# Patient Record
Sex: Female | Born: 1945
Health system: Southern US, Community
[De-identification: ages and names within clinical notes are randomized; demographics above are authoritative.]

## PROBLEM LIST (undated history)

## (undated) DIAGNOSIS — G2581 Restless legs syndrome: Secondary | ICD-10-CM

## (undated) DIAGNOSIS — S99922A Unspecified injury of left foot, initial encounter: Secondary | ICD-10-CM

## (undated) DIAGNOSIS — E785 Hyperlipidemia, unspecified: Secondary | ICD-10-CM

## (undated) DIAGNOSIS — M779 Enthesopathy, unspecified: Secondary | ICD-10-CM

## (undated) DIAGNOSIS — M84475D Pathological fracture, left foot, subsequent encounter for fracture with routine healing: Secondary | ICD-10-CM

## (undated) DIAGNOSIS — M1990 Unspecified osteoarthritis, unspecified site: Secondary | ICD-10-CM

## (undated) DIAGNOSIS — M7120 Synovial cyst of popliteal space [Baker], unspecified knee: Secondary | ICD-10-CM

## (undated) DIAGNOSIS — K219 Gastro-esophageal reflux disease without esophagitis: Secondary | ICD-10-CM

## (undated) DIAGNOSIS — G43909 Migraine, unspecified, not intractable, without status migrainosus: Secondary | ICD-10-CM

## (undated) DIAGNOSIS — I10 Essential (primary) hypertension: Secondary | ICD-10-CM

## (undated) DIAGNOSIS — I5022 Chronic systolic (congestive) heart failure: Secondary | ICD-10-CM

## (undated) DIAGNOSIS — I447 Left bundle-branch block, unspecified: Secondary | ICD-10-CM

## (undated) DIAGNOSIS — R079 Chest pain, unspecified: Secondary | ICD-10-CM

## (undated) DIAGNOSIS — M19072 Primary osteoarthritis, left ankle and foot: Secondary | ICD-10-CM

## (undated) HISTORY — DX: Left bundle-branch block, unspecified: I44.7

## (undated) HISTORY — DX: Gastro-esophageal reflux disease without esophagitis: K21.9

## (undated) HISTORY — PX: CHOLECYSTECTOMY: SHX55

## (undated) HISTORY — DX: Unspecified injury of left foot, initial encounter: S99.922A

## (undated) HISTORY — DX: Pathological fracture, left foot, subsequent encounter for fracture with routine healing: M84.475D

## (undated) HISTORY — DX: Primary osteoarthritis, left ankle and foot: M19.072

## (undated) HISTORY — PX: VEIN SURGERY: SHX48

## (undated) HISTORY — DX: Chest pain, unspecified: R07.9

## (undated) HISTORY — DX: Enthesopathy, unspecified: M77.9

## (undated) HISTORY — DX: Migraine, unspecified, not intractable, without status migrainosus: G43.909

## (undated) HISTORY — DX: Unspecified osteoarthritis, unspecified site: M19.90

## (undated) HISTORY — DX: Essential (primary) hypertension: I10

## (undated) HISTORY — PX: FOOT FRACTURE SURGERY: SHX645

## (undated) HISTORY — DX: Synovial cyst of popliteal space (Baker), unspecified knee: M71.20

## (undated) HISTORY — DX: Chronic systolic (congestive) heart failure: I50.22

## (undated) HISTORY — PX: ABDOMINAL HYSTERECTOMY: SHX81

## (undated) HISTORY — DX: Restless legs syndrome: G25.81

## (undated) HISTORY — PX: TONSILLECTOMY: SUR1361

## (undated) HISTORY — DX: Hyperlipidemia, unspecified: E78.5

---

## 2015-04-25 DIAGNOSIS — R079 Chest pain, unspecified: Secondary | ICD-10-CM

## 2015-04-25 DIAGNOSIS — I447 Left bundle-branch block, unspecified: Secondary | ICD-10-CM

## 2015-04-25 HISTORY — DX: Chest pain, unspecified: R07.9

## 2015-04-25 HISTORY — DX: Left bundle-branch block, unspecified: I44.7

## 2015-04-26 DIAGNOSIS — G2581 Restless legs syndrome: Secondary | ICD-10-CM

## 2015-04-26 DIAGNOSIS — I10 Essential (primary) hypertension: Secondary | ICD-10-CM

## 2015-04-26 HISTORY — DX: Restless legs syndrome: G25.81

## 2015-04-26 HISTORY — DX: Essential (primary) hypertension: I10

## 2015-05-16 DIAGNOSIS — R079 Chest pain, unspecified: Secondary | ICD-10-CM | POA: Diagnosis not present

## 2015-05-16 DIAGNOSIS — I447 Left bundle-branch block, unspecified: Secondary | ICD-10-CM | POA: Diagnosis not present

## 2015-05-16 DIAGNOSIS — R011 Cardiac murmur, unspecified: Secondary | ICD-10-CM | POA: Diagnosis not present

## 2015-05-24 DIAGNOSIS — I10 Essential (primary) hypertension: Secondary | ICD-10-CM | POA: Diagnosis not present

## 2015-05-24 DIAGNOSIS — I447 Left bundle-branch block, unspecified: Secondary | ICD-10-CM | POA: Diagnosis not present

## 2015-06-20 DIAGNOSIS — E876 Hypokalemia: Secondary | ICD-10-CM | POA: Diagnosis not present

## 2015-06-27 DIAGNOSIS — S99922A Unspecified injury of left foot, initial encounter: Secondary | ICD-10-CM | POA: Diagnosis not present

## 2015-07-11 DIAGNOSIS — J208 Acute bronchitis due to other specified organisms: Secondary | ICD-10-CM | POA: Diagnosis not present

## 2015-07-11 DIAGNOSIS — I10 Essential (primary) hypertension: Secondary | ICD-10-CM | POA: Diagnosis not present

## 2015-07-11 DIAGNOSIS — B9689 Other specified bacterial agents as the cause of diseases classified elsewhere: Secondary | ICD-10-CM | POA: Diagnosis not present

## 2015-07-18 DIAGNOSIS — S92342D Displaced fracture of fourth metatarsal bone, left foot, subsequent encounter for fracture with routine healing: Secondary | ICD-10-CM | POA: Diagnosis not present

## 2015-07-25 DIAGNOSIS — S99922A Unspecified injury of left foot, initial encounter: Secondary | ICD-10-CM

## 2015-07-25 DIAGNOSIS — S99922D Unspecified injury of left foot, subsequent encounter: Secondary | ICD-10-CM | POA: Diagnosis not present

## 2015-07-25 HISTORY — DX: Unspecified injury of left foot, initial encounter: S99.922A

## 2015-08-02 DIAGNOSIS — S4992XA Unspecified injury of left shoulder and upper arm, initial encounter: Secondary | ICD-10-CM | POA: Diagnosis not present

## 2015-08-02 DIAGNOSIS — S0990XA Unspecified injury of head, initial encounter: Secondary | ICD-10-CM | POA: Diagnosis not present

## 2015-08-02 DIAGNOSIS — R109 Unspecified abdominal pain: Secondary | ICD-10-CM | POA: Diagnosis not present

## 2015-08-02 DIAGNOSIS — M79672 Pain in left foot: Secondary | ICD-10-CM | POA: Diagnosis not present

## 2015-08-02 DIAGNOSIS — S299XXA Unspecified injury of thorax, initial encounter: Secondary | ICD-10-CM | POA: Diagnosis not present

## 2015-08-02 DIAGNOSIS — M25512 Pain in left shoulder: Secondary | ICD-10-CM | POA: Diagnosis not present

## 2015-08-02 DIAGNOSIS — R51 Headache: Secondary | ICD-10-CM | POA: Diagnosis not present

## 2015-08-02 DIAGNOSIS — R918 Other nonspecific abnormal finding of lung field: Secondary | ICD-10-CM | POA: Diagnosis not present

## 2015-08-02 DIAGNOSIS — M25532 Pain in left wrist: Secondary | ICD-10-CM | POA: Diagnosis not present

## 2015-08-02 DIAGNOSIS — S161XXA Strain of muscle, fascia and tendon at neck level, initial encounter: Secondary | ICD-10-CM | POA: Diagnosis not present

## 2015-08-02 DIAGNOSIS — J3489 Other specified disorders of nose and nasal sinuses: Secondary | ICD-10-CM | POA: Diagnosis not present

## 2015-08-02 DIAGNOSIS — S8991XA Unspecified injury of right lower leg, initial encounter: Secondary | ICD-10-CM | POA: Diagnosis not present

## 2015-08-02 DIAGNOSIS — M25531 Pain in right wrist: Secondary | ICD-10-CM | POA: Diagnosis not present

## 2015-08-02 DIAGNOSIS — M25561 Pain in right knee: Secondary | ICD-10-CM | POA: Diagnosis not present

## 2015-08-02 DIAGNOSIS — S6991XA Unspecified injury of right wrist, hand and finger(s), initial encounter: Secondary | ICD-10-CM | POA: Diagnosis not present

## 2015-08-02 DIAGNOSIS — S0993XA Unspecified injury of face, initial encounter: Secondary | ICD-10-CM | POA: Diagnosis not present

## 2015-08-02 DIAGNOSIS — T148 Other injury of unspecified body region: Secondary | ICD-10-CM | POA: Diagnosis not present

## 2015-08-02 DIAGNOSIS — S6992XA Unspecified injury of left wrist, hand and finger(s), initial encounter: Secondary | ICD-10-CM | POA: Diagnosis not present

## 2015-08-02 DIAGNOSIS — S199XXA Unspecified injury of neck, initial encounter: Secondary | ICD-10-CM | POA: Diagnosis not present

## 2015-08-02 DIAGNOSIS — M79642 Pain in left hand: Secondary | ICD-10-CM | POA: Diagnosis not present

## 2015-08-15 DIAGNOSIS — M84475D Pathological fracture, left foot, subsequent encounter for fracture with routine healing: Secondary | ICD-10-CM

## 2015-08-15 DIAGNOSIS — M84478D Pathological fracture, left toe(s), subsequent encounter for fracture with routine healing: Secondary | ICD-10-CM | POA: Diagnosis not present

## 2015-08-15 HISTORY — DX: Pathological fracture, left foot, subsequent encounter for fracture with routine healing: M84.475D

## 2015-09-19 DIAGNOSIS — L570 Actinic keratosis: Secondary | ICD-10-CM | POA: Diagnosis not present

## 2015-09-19 DIAGNOSIS — L0100 Impetigo, unspecified: Secondary | ICD-10-CM | POA: Diagnosis not present

## 2015-11-10 DIAGNOSIS — Z0000 Encounter for general adult medical examination without abnormal findings: Secondary | ICD-10-CM | POA: Diagnosis not present

## 2015-11-14 DIAGNOSIS — M19072 Primary osteoarthritis, left ankle and foot: Secondary | ICD-10-CM

## 2015-11-14 DIAGNOSIS — M19272 Secondary osteoarthritis, left ankle and foot: Secondary | ICD-10-CM | POA: Diagnosis not present

## 2015-11-14 HISTORY — DX: Primary osteoarthritis, left ankle and foot: M19.072

## 2015-12-06 DIAGNOSIS — I10 Essential (primary) hypertension: Secondary | ICD-10-CM | POA: Diagnosis not present

## 2015-12-06 DIAGNOSIS — Z0000 Encounter for general adult medical examination without abnormal findings: Secondary | ICD-10-CM | POA: Diagnosis not present

## 2015-12-14 DIAGNOSIS — M778 Other enthesopathies, not elsewhere classified: Secondary | ICD-10-CM

## 2015-12-14 DIAGNOSIS — M779 Enthesopathy, unspecified: Secondary | ICD-10-CM

## 2015-12-14 DIAGNOSIS — M7752 Other enthesopathy of left foot: Secondary | ICD-10-CM | POA: Diagnosis not present

## 2015-12-14 HISTORY — DX: Other enthesopathies, not elsewhere classified: M77.8

## 2015-12-17 DIAGNOSIS — N3001 Acute cystitis with hematuria: Secondary | ICD-10-CM | POA: Diagnosis not present

## 2016-01-09 DIAGNOSIS — M19072 Primary osteoarthritis, left ankle and foot: Secondary | ICD-10-CM

## 2016-01-09 HISTORY — DX: Primary osteoarthritis, left ankle and foot: M19.072

## 2016-01-27 DIAGNOSIS — J209 Acute bronchitis, unspecified: Secondary | ICD-10-CM | POA: Diagnosis not present

## 2016-01-27 DIAGNOSIS — J449 Chronic obstructive pulmonary disease, unspecified: Secondary | ICD-10-CM | POA: Diagnosis not present

## 2016-02-15 DIAGNOSIS — S6291XA Unspecified fracture of right wrist and hand, initial encounter for closed fracture: Secondary | ICD-10-CM | POA: Diagnosis not present

## 2016-02-19 DIAGNOSIS — S62306A Unspecified fracture of fifth metacarpal bone, right hand, initial encounter for closed fracture: Secondary | ICD-10-CM | POA: Diagnosis not present

## 2016-02-26 DIAGNOSIS — S62306A Unspecified fracture of fifth metacarpal bone, right hand, initial encounter for closed fracture: Secondary | ICD-10-CM | POA: Diagnosis not present

## 2016-02-27 DIAGNOSIS — J208 Acute bronchitis due to other specified organisms: Secondary | ICD-10-CM | POA: Diagnosis not present

## 2016-02-27 DIAGNOSIS — B9689 Other specified bacterial agents as the cause of diseases classified elsewhere: Secondary | ICD-10-CM | POA: Diagnosis not present

## 2016-02-27 DIAGNOSIS — I10 Essential (primary) hypertension: Secondary | ICD-10-CM | POA: Diagnosis not present

## 2016-03-04 DIAGNOSIS — A481 Legionnaires' disease: Secondary | ICD-10-CM | POA: Diagnosis not present

## 2016-03-04 DIAGNOSIS — R05 Cough: Secondary | ICD-10-CM | POA: Diagnosis not present

## 2016-03-04 DIAGNOSIS — I10 Essential (primary) hypertension: Secondary | ICD-10-CM | POA: Diagnosis not present

## 2016-03-04 DIAGNOSIS — R69 Illness, unspecified: Secondary | ICD-10-CM | POA: Diagnosis not present

## 2016-03-04 DIAGNOSIS — A3701 Whooping cough due to Bordetella pertussis with pneumonia: Secondary | ICD-10-CM | POA: Diagnosis not present

## 2016-03-11 DIAGNOSIS — S62306A Unspecified fracture of fifth metacarpal bone, right hand, initial encounter for closed fracture: Secondary | ICD-10-CM | POA: Diagnosis not present

## 2016-03-15 DIAGNOSIS — M8588 Other specified disorders of bone density and structure, other site: Secondary | ICD-10-CM | POA: Diagnosis not present

## 2016-03-15 DIAGNOSIS — M8580 Other specified disorders of bone density and structure, unspecified site: Secondary | ICD-10-CM | POA: Diagnosis not present

## 2016-03-15 DIAGNOSIS — Z780 Asymptomatic menopausal state: Secondary | ICD-10-CM | POA: Diagnosis not present

## 2016-03-28 DIAGNOSIS — G8929 Other chronic pain: Secondary | ICD-10-CM | POA: Diagnosis not present

## 2016-03-28 DIAGNOSIS — A3790 Whooping cough, unspecified species without pneumonia: Secondary | ICD-10-CM | POA: Diagnosis not present

## 2016-03-28 DIAGNOSIS — Z1389 Encounter for screening for other disorder: Secondary | ICD-10-CM | POA: Diagnosis not present

## 2016-03-28 DIAGNOSIS — M545 Low back pain: Secondary | ICD-10-CM | POA: Diagnosis not present

## 2016-03-28 DIAGNOSIS — G2581 Restless legs syndrome: Secondary | ICD-10-CM | POA: Diagnosis not present

## 2016-03-28 DIAGNOSIS — Z9181 History of falling: Secondary | ICD-10-CM | POA: Diagnosis not present

## 2016-04-01 DIAGNOSIS — S62306A Unspecified fracture of fifth metacarpal bone, right hand, initial encounter for closed fracture: Secondary | ICD-10-CM | POA: Diagnosis not present

## 2016-04-30 DIAGNOSIS — S62306K Unspecified fracture of fifth metacarpal bone, right hand, subsequent encounter for fracture with nonunion: Secondary | ICD-10-CM | POA: Diagnosis not present

## 2016-05-13 DIAGNOSIS — J111 Influenza due to unidentified influenza virus with other respiratory manifestations: Secondary | ICD-10-CM | POA: Diagnosis not present

## 2016-05-20 DIAGNOSIS — S62306K Unspecified fracture of fifth metacarpal bone, right hand, subsequent encounter for fracture with nonunion: Secondary | ICD-10-CM | POA: Diagnosis not present

## 2016-05-24 DIAGNOSIS — M810 Age-related osteoporosis without current pathological fracture: Secondary | ICD-10-CM | POA: Diagnosis not present

## 2016-05-24 DIAGNOSIS — S62306K Unspecified fracture of fifth metacarpal bone, right hand, subsequent encounter for fracture with nonunion: Secondary | ICD-10-CM | POA: Diagnosis not present

## 2016-06-26 DIAGNOSIS — I340 Nonrheumatic mitral (valve) insufficiency: Secondary | ICD-10-CM | POA: Diagnosis not present

## 2016-06-26 DIAGNOSIS — M940 Chondrocostal junction syndrome [Tietze]: Secondary | ICD-10-CM | POA: Diagnosis not present

## 2016-06-26 DIAGNOSIS — I447 Left bundle-branch block, unspecified: Secondary | ICD-10-CM | POA: Diagnosis not present

## 2016-06-26 DIAGNOSIS — I10 Essential (primary) hypertension: Secondary | ICD-10-CM | POA: Diagnosis not present

## 2016-07-01 DIAGNOSIS — S62306K Unspecified fracture of fifth metacarpal bone, right hand, subsequent encounter for fracture with nonunion: Secondary | ICD-10-CM | POA: Diagnosis not present

## 2016-07-10 DIAGNOSIS — I10 Essential (primary) hypertension: Secondary | ICD-10-CM | POA: Diagnosis not present

## 2016-07-12 DIAGNOSIS — R69 Illness, unspecified: Secondary | ICD-10-CM | POA: Diagnosis not present

## 2016-07-23 DIAGNOSIS — M159 Polyosteoarthritis, unspecified: Secondary | ICD-10-CM | POA: Diagnosis not present

## 2016-07-23 DIAGNOSIS — Z6834 Body mass index (BMI) 34.0-34.9, adult: Secondary | ICD-10-CM | POA: Diagnosis not present

## 2016-07-23 DIAGNOSIS — G2581 Restless legs syndrome: Secondary | ICD-10-CM | POA: Diagnosis not present

## 2016-07-23 DIAGNOSIS — R69 Illness, unspecified: Secondary | ICD-10-CM | POA: Diagnosis not present

## 2016-07-23 DIAGNOSIS — H259 Unspecified age-related cataract: Secondary | ICD-10-CM | POA: Diagnosis not present

## 2016-07-23 DIAGNOSIS — Z0000 Encounter for general adult medical examination without abnormal findings: Secondary | ICD-10-CM | POA: Diagnosis not present

## 2016-07-23 DIAGNOSIS — K219 Gastro-esophageal reflux disease without esophagitis: Secondary | ICD-10-CM | POA: Diagnosis not present

## 2016-07-23 DIAGNOSIS — E669 Obesity, unspecified: Secondary | ICD-10-CM | POA: Diagnosis not present

## 2016-07-23 DIAGNOSIS — I10 Essential (primary) hypertension: Secondary | ICD-10-CM | POA: Diagnosis not present

## 2016-07-23 DIAGNOSIS — R600 Localized edema: Secondary | ICD-10-CM | POA: Diagnosis not present

## 2016-07-24 DIAGNOSIS — I10 Essential (primary) hypertension: Secondary | ICD-10-CM | POA: Diagnosis not present

## 2016-08-01 DIAGNOSIS — I10 Essential (primary) hypertension: Secondary | ICD-10-CM | POA: Diagnosis not present

## 2016-08-12 DIAGNOSIS — S62306K Unspecified fracture of fifth metacarpal bone, right hand, subsequent encounter for fracture with nonunion: Secondary | ICD-10-CM | POA: Diagnosis not present

## 2016-08-15 DIAGNOSIS — M25512 Pain in left shoulder: Secondary | ICD-10-CM | POA: Diagnosis not present

## 2016-09-04 DIAGNOSIS — I10 Essential (primary) hypertension: Secondary | ICD-10-CM | POA: Diagnosis not present

## 2016-09-04 DIAGNOSIS — G2581 Restless legs syndrome: Secondary | ICD-10-CM | POA: Diagnosis not present

## 2016-09-04 DIAGNOSIS — J309 Allergic rhinitis, unspecified: Secondary | ICD-10-CM | POA: Diagnosis not present

## 2016-09-08 DIAGNOSIS — J189 Pneumonia, unspecified organism: Secondary | ICD-10-CM | POA: Diagnosis not present

## 2016-09-08 DIAGNOSIS — R69 Illness, unspecified: Secondary | ICD-10-CM | POA: Diagnosis not present

## 2016-09-19 DIAGNOSIS — Z0000 Encounter for general adult medical examination without abnormal findings: Secondary | ICD-10-CM | POA: Diagnosis not present

## 2016-09-19 DIAGNOSIS — I5030 Unspecified diastolic (congestive) heart failure: Secondary | ICD-10-CM | POA: Diagnosis not present

## 2016-09-19 DIAGNOSIS — M545 Low back pain: Secondary | ICD-10-CM | POA: Diagnosis not present

## 2016-09-19 DIAGNOSIS — Z6833 Body mass index (BMI) 33.0-33.9, adult: Secondary | ICD-10-CM | POA: Diagnosis not present

## 2016-09-19 DIAGNOSIS — I110 Hypertensive heart disease with heart failure: Secondary | ICD-10-CM | POA: Diagnosis not present

## 2016-09-26 DIAGNOSIS — L821 Other seborrheic keratosis: Secondary | ICD-10-CM | POA: Diagnosis not present

## 2016-09-26 DIAGNOSIS — L570 Actinic keratosis: Secondary | ICD-10-CM | POA: Diagnosis not present

## 2016-09-26 DIAGNOSIS — R233 Spontaneous ecchymoses: Secondary | ICD-10-CM | POA: Diagnosis not present

## 2016-10-21 DIAGNOSIS — L03116 Cellulitis of left lower limb: Secondary | ICD-10-CM | POA: Diagnosis not present

## 2016-10-21 DIAGNOSIS — L03115 Cellulitis of right lower limb: Secondary | ICD-10-CM | POA: Diagnosis not present

## 2016-10-21 DIAGNOSIS — R600 Localized edema: Secondary | ICD-10-CM | POA: Diagnosis not present

## 2016-10-21 DIAGNOSIS — Z6834 Body mass index (BMI) 34.0-34.9, adult: Secondary | ICD-10-CM | POA: Diagnosis not present

## 2016-10-24 DIAGNOSIS — L03116 Cellulitis of left lower limb: Secondary | ICD-10-CM | POA: Diagnosis not present

## 2016-10-24 DIAGNOSIS — Z6834 Body mass index (BMI) 34.0-34.9, adult: Secondary | ICD-10-CM | POA: Diagnosis not present

## 2016-10-24 DIAGNOSIS — R600 Localized edema: Secondary | ICD-10-CM | POA: Diagnosis not present

## 2016-10-24 DIAGNOSIS — L03115 Cellulitis of right lower limb: Secondary | ICD-10-CM | POA: Diagnosis not present

## 2016-10-25 DIAGNOSIS — L03115 Cellulitis of right lower limb: Secondary | ICD-10-CM | POA: Diagnosis not present

## 2016-10-25 DIAGNOSIS — L03119 Cellulitis of unspecified part of limb: Secondary | ICD-10-CM | POA: Diagnosis not present

## 2016-10-25 DIAGNOSIS — L03116 Cellulitis of left lower limb: Secondary | ICD-10-CM | POA: Diagnosis not present

## 2016-11-04 DIAGNOSIS — Z6834 Body mass index (BMI) 34.0-34.9, adult: Secondary | ICD-10-CM | POA: Diagnosis not present

## 2016-11-04 DIAGNOSIS — T677XXA Heat edema, initial encounter: Secondary | ICD-10-CM | POA: Diagnosis not present

## 2016-11-04 DIAGNOSIS — I509 Heart failure, unspecified: Secondary | ICD-10-CM | POA: Diagnosis not present

## 2016-11-15 DIAGNOSIS — M7552 Bursitis of left shoulder: Secondary | ICD-10-CM | POA: Diagnosis not present

## 2016-11-20 DIAGNOSIS — M1990 Unspecified osteoarthritis, unspecified site: Secondary | ICD-10-CM | POA: Diagnosis not present

## 2016-11-20 DIAGNOSIS — I5032 Chronic diastolic (congestive) heart failure: Secondary | ICD-10-CM | POA: Diagnosis not present

## 2016-11-20 DIAGNOSIS — I5030 Unspecified diastolic (congestive) heart failure: Secondary | ICD-10-CM | POA: Diagnosis not present

## 2016-11-20 DIAGNOSIS — Z6833 Body mass index (BMI) 33.0-33.9, adult: Secondary | ICD-10-CM | POA: Diagnosis not present

## 2016-11-20 DIAGNOSIS — G8929 Other chronic pain: Secondary | ICD-10-CM | POA: Diagnosis not present

## 2016-11-20 DIAGNOSIS — I110 Hypertensive heart disease with heart failure: Secondary | ICD-10-CM | POA: Diagnosis not present

## 2016-11-20 DIAGNOSIS — M545 Low back pain: Secondary | ICD-10-CM | POA: Diagnosis not present

## 2016-11-26 DIAGNOSIS — S81812A Laceration without foreign body, left lower leg, initial encounter: Secondary | ICD-10-CM | POA: Diagnosis not present

## 2016-11-29 ENCOUNTER — Other Ambulatory Visit: Payer: Self-pay

## 2016-11-29 MED ORDER — AMLODIPINE BESYLATE 5 MG PO TABS: 5.0000 mg | ORAL_TABLET | Freq: Two times a day (BID) | ORAL | 1 refills | Status: DC

## 2016-11-29 NOTE — Telephone Encounter (Signed)
Received fax from [facility] to refill amlodipine. Confirmed with patient that she will be following with Dr. Bettina Gavia. Refill sent through October at time that patient is to return for office visit.

## 2016-12-01 DIAGNOSIS — R197 Diarrhea, unspecified: Secondary | ICD-10-CM | POA: Diagnosis not present

## 2016-12-01 DIAGNOSIS — R109 Unspecified abdominal pain: Secondary | ICD-10-CM | POA: Diagnosis not present

## 2016-12-05 DIAGNOSIS — I110 Hypertensive heart disease with heart failure: Secondary | ICD-10-CM | POA: Diagnosis not present

## 2016-12-05 DIAGNOSIS — M1990 Unspecified osteoarthritis, unspecified site: Secondary | ICD-10-CM | POA: Diagnosis not present

## 2016-12-05 DIAGNOSIS — Z6833 Body mass index (BMI) 33.0-33.9, adult: Secondary | ICD-10-CM | POA: Diagnosis not present

## 2016-12-05 DIAGNOSIS — I5030 Unspecified diastolic (congestive) heart failure: Secondary | ICD-10-CM | POA: Diagnosis not present

## 2016-12-05 DIAGNOSIS — M545 Low back pain: Secondary | ICD-10-CM | POA: Diagnosis not present

## 2016-12-05 DIAGNOSIS — G8929 Other chronic pain: Secondary | ICD-10-CM | POA: Diagnosis not present

## 2016-12-05 DIAGNOSIS — I5032 Chronic diastolic (congestive) heart failure: Secondary | ICD-10-CM | POA: Diagnosis not present

## 2016-12-24 DIAGNOSIS — C44519 Basal cell carcinoma of skin of other part of trunk: Secondary | ICD-10-CM | POA: Diagnosis not present

## 2017-01-15 DIAGNOSIS — N10 Acute tubulo-interstitial nephritis: Secondary | ICD-10-CM | POA: Diagnosis not present

## 2017-01-15 DIAGNOSIS — M545 Low back pain: Secondary | ICD-10-CM | POA: Diagnosis not present

## 2017-01-15 DIAGNOSIS — S81802S Unspecified open wound, left lower leg, sequela: Secondary | ICD-10-CM | POA: Diagnosis not present

## 2017-01-15 DIAGNOSIS — Z6833 Body mass index (BMI) 33.0-33.9, adult: Secondary | ICD-10-CM | POA: Diagnosis not present

## 2017-01-16 DIAGNOSIS — S80812A Abrasion, left lower leg, initial encounter: Secondary | ICD-10-CM | POA: Diagnosis not present

## 2017-01-23 DIAGNOSIS — C44519 Basal cell carcinoma of skin of other part of trunk: Secondary | ICD-10-CM | POA: Diagnosis not present

## 2017-01-23 DIAGNOSIS — C44529 Squamous cell carcinoma of skin of other part of trunk: Secondary | ICD-10-CM | POA: Diagnosis not present

## 2017-01-25 DIAGNOSIS — S99102A Unspecified physeal fracture of left metatarsal, initial encounter for closed fracture: Secondary | ICD-10-CM | POA: Diagnosis not present

## 2017-01-25 DIAGNOSIS — M79672 Pain in left foot: Secondary | ICD-10-CM | POA: Diagnosis not present

## 2017-01-27 ENCOUNTER — Other Ambulatory Visit: Payer: Self-pay

## 2017-01-28 DIAGNOSIS — S92355A Nondisplaced fracture of fifth metatarsal bone, left foot, initial encounter for closed fracture: Secondary | ICD-10-CM | POA: Diagnosis not present

## 2017-01-28 DIAGNOSIS — S92355D Nondisplaced fracture of fifth metatarsal bone, left foot, subsequent encounter for fracture with routine healing: Secondary | ICD-10-CM | POA: Diagnosis not present

## 2017-01-28 DIAGNOSIS — M79672 Pain in left foot: Secondary | ICD-10-CM | POA: Diagnosis not present

## 2017-02-04 DIAGNOSIS — N10 Acute tubulo-interstitial nephritis: Secondary | ICD-10-CM | POA: Diagnosis not present

## 2017-02-06 DIAGNOSIS — M1990 Unspecified osteoarthritis, unspecified site: Secondary | ICD-10-CM

## 2017-02-06 DIAGNOSIS — G43909 Migraine, unspecified, not intractable, without status migrainosus: Secondary | ICD-10-CM

## 2017-02-06 DIAGNOSIS — E785 Hyperlipidemia, unspecified: Secondary | ICD-10-CM | POA: Insufficient documentation

## 2017-02-06 DIAGNOSIS — M7120 Synovial cyst of popliteal space [Baker], unspecified knee: Secondary | ICD-10-CM

## 2017-02-06 HISTORY — DX: Migraine, unspecified, not intractable, without status migrainosus: G43.909

## 2017-02-06 HISTORY — DX: Hyperlipidemia, unspecified: E78.5

## 2017-02-06 HISTORY — DX: Unspecified osteoarthritis, unspecified site: M19.90

## 2017-02-06 HISTORY — DX: Synovial cyst of popliteal space (Baker), unspecified knee: M71.20

## 2017-02-18 DIAGNOSIS — Z6833 Body mass index (BMI) 33.0-33.9, adult: Secondary | ICD-10-CM | POA: Diagnosis not present

## 2017-02-18 DIAGNOSIS — I5030 Unspecified diastolic (congestive) heart failure: Secondary | ICD-10-CM | POA: Diagnosis not present

## 2017-02-18 DIAGNOSIS — L97911 Non-pressure chronic ulcer of unspecified part of right lower leg limited to breakdown of skin: Secondary | ICD-10-CM | POA: Diagnosis not present

## 2017-02-18 DIAGNOSIS — I110 Hypertensive heart disease with heart failure: Secondary | ICD-10-CM | POA: Diagnosis not present

## 2017-02-18 DIAGNOSIS — Z23 Encounter for immunization: Secondary | ICD-10-CM | POA: Diagnosis not present

## 2017-02-18 DIAGNOSIS — S92355D Nondisplaced fracture of fifth metatarsal bone, left foot, subsequent encounter for fracture with routine healing: Secondary | ICD-10-CM | POA: Diagnosis not present

## 2017-02-18 DIAGNOSIS — S92352G Displaced fracture of fifth metatarsal bone, left foot, subsequent encounter for fracture with delayed healing: Secondary | ICD-10-CM | POA: Diagnosis not present

## 2017-02-18 DIAGNOSIS — M84375K Stress fracture, left foot, subsequent encounter for fracture with nonunion: Secondary | ICD-10-CM | POA: Diagnosis not present

## 2017-02-18 DIAGNOSIS — M810 Age-related osteoporosis without current pathological fracture: Secondary | ICD-10-CM | POA: Diagnosis not present

## 2017-02-19 ENCOUNTER — Ambulatory Visit: Payer: Self-pay | Admitting: Cardiology

## 2017-02-19 DIAGNOSIS — L97929 Non-pressure chronic ulcer of unspecified part of left lower leg with unspecified severity: Secondary | ICD-10-CM

## 2017-02-19 DIAGNOSIS — I83029 Varicose veins of left lower extremity with ulcer of unspecified site: Secondary | ICD-10-CM

## 2017-02-19 HISTORY — DX: Non-pressure chronic ulcer of unspecified part of left lower leg with unspecified severity: L97.929

## 2017-02-19 HISTORY — DX: Varicose veins of left lower extremity with ulcer of unspecified site: I83.029

## 2017-02-21 DIAGNOSIS — J449 Chronic obstructive pulmonary disease, unspecified: Secondary | ICD-10-CM | POA: Diagnosis not present

## 2017-02-21 DIAGNOSIS — L97812 Non-pressure chronic ulcer of other part of right lower leg with fat layer exposed: Secondary | ICD-10-CM | POA: Diagnosis not present

## 2017-02-21 DIAGNOSIS — L97822 Non-pressure chronic ulcer of other part of left lower leg with fat layer exposed: Secondary | ICD-10-CM | POA: Diagnosis not present

## 2017-02-21 DIAGNOSIS — S81801A Unspecified open wound, right lower leg, initial encounter: Secondary | ICD-10-CM | POA: Diagnosis not present

## 2017-02-21 DIAGNOSIS — I872 Venous insufficiency (chronic) (peripheral): Secondary | ICD-10-CM | POA: Diagnosis not present

## 2017-02-21 DIAGNOSIS — L03116 Cellulitis of left lower limb: Secondary | ICD-10-CM | POA: Diagnosis not present

## 2017-02-21 DIAGNOSIS — X58XXXA Exposure to other specified factors, initial encounter: Secondary | ICD-10-CM | POA: Diagnosis not present

## 2017-02-21 DIAGNOSIS — I87311 Chronic venous hypertension (idiopathic) with ulcer of right lower extremity: Secondary | ICD-10-CM | POA: Diagnosis not present

## 2017-02-21 DIAGNOSIS — M069 Rheumatoid arthritis, unspecified: Secondary | ICD-10-CM | POA: Diagnosis not present

## 2017-02-28 DIAGNOSIS — S81801D Unspecified open wound, right lower leg, subsequent encounter: Secondary | ICD-10-CM | POA: Diagnosis not present

## 2017-02-28 DIAGNOSIS — L03116 Cellulitis of left lower limb: Secondary | ICD-10-CM | POA: Diagnosis not present

## 2017-02-28 DIAGNOSIS — I87331 Chronic venous hypertension (idiopathic) with ulcer and inflammation of right lower extremity: Secondary | ICD-10-CM | POA: Diagnosis not present

## 2017-02-28 DIAGNOSIS — L97822 Non-pressure chronic ulcer of other part of left lower leg with fat layer exposed: Secondary | ICD-10-CM | POA: Diagnosis not present

## 2017-02-28 DIAGNOSIS — X58XXXD Exposure to other specified factors, subsequent encounter: Secondary | ICD-10-CM | POA: Diagnosis not present

## 2017-03-07 DIAGNOSIS — X58XXXD Exposure to other specified factors, subsequent encounter: Secondary | ICD-10-CM | POA: Diagnosis not present

## 2017-03-07 DIAGNOSIS — S81801D Unspecified open wound, right lower leg, subsequent encounter: Secondary | ICD-10-CM | POA: Diagnosis not present

## 2017-03-07 DIAGNOSIS — L97822 Non-pressure chronic ulcer of other part of left lower leg with fat layer exposed: Secondary | ICD-10-CM | POA: Diagnosis not present

## 2017-03-07 DIAGNOSIS — L97812 Non-pressure chronic ulcer of other part of right lower leg with fat layer exposed: Secondary | ICD-10-CM | POA: Diagnosis not present

## 2017-03-07 DIAGNOSIS — L03116 Cellulitis of left lower limb: Secondary | ICD-10-CM | POA: Diagnosis not present

## 2017-03-07 DIAGNOSIS — I87313 Chronic venous hypertension (idiopathic) with ulcer of bilateral lower extremity: Secondary | ICD-10-CM | POA: Diagnosis not present

## 2017-03-11 DIAGNOSIS — L97909 Non-pressure chronic ulcer of unspecified part of unspecified lower leg with unspecified severity: Secondary | ICD-10-CM | POA: Diagnosis not present

## 2017-03-11 DIAGNOSIS — L97219 Non-pressure chronic ulcer of right calf with unspecified severity: Secondary | ICD-10-CM | POA: Diagnosis not present

## 2017-03-11 DIAGNOSIS — L97229 Non-pressure chronic ulcer of left calf with unspecified severity: Secondary | ICD-10-CM | POA: Diagnosis not present

## 2017-03-11 DIAGNOSIS — I87313 Chronic venous hypertension (idiopathic) with ulcer of bilateral lower extremity: Secondary | ICD-10-CM | POA: Diagnosis not present

## 2017-03-14 DIAGNOSIS — S81802A Unspecified open wound, left lower leg, initial encounter: Secondary | ICD-10-CM | POA: Diagnosis not present

## 2017-03-14 DIAGNOSIS — I872 Venous insufficiency (chronic) (peripheral): Secondary | ICD-10-CM | POA: Diagnosis not present

## 2017-03-14 DIAGNOSIS — L03116 Cellulitis of left lower limb: Secondary | ICD-10-CM | POA: Diagnosis not present

## 2017-03-14 DIAGNOSIS — L97822 Non-pressure chronic ulcer of other part of left lower leg with fat layer exposed: Secondary | ICD-10-CM | POA: Diagnosis not present

## 2017-03-14 DIAGNOSIS — I87311 Chronic venous hypertension (idiopathic) with ulcer of right lower extremity: Secondary | ICD-10-CM | POA: Diagnosis not present

## 2017-03-14 DIAGNOSIS — L97811 Non-pressure chronic ulcer of other part of right lower leg limited to breakdown of skin: Secondary | ICD-10-CM | POA: Diagnosis not present

## 2017-03-14 DIAGNOSIS — W19XXXA Unspecified fall, initial encounter: Secondary | ICD-10-CM | POA: Diagnosis not present

## 2017-03-21 DIAGNOSIS — S81802A Unspecified open wound, left lower leg, initial encounter: Secondary | ICD-10-CM | POA: Diagnosis not present

## 2017-03-21 DIAGNOSIS — I87311 Chronic venous hypertension (idiopathic) with ulcer of right lower extremity: Secondary | ICD-10-CM | POA: Diagnosis not present

## 2017-03-21 DIAGNOSIS — L97812 Non-pressure chronic ulcer of other part of right lower leg with fat layer exposed: Secondary | ICD-10-CM | POA: Diagnosis not present

## 2017-03-21 DIAGNOSIS — W19XXXA Unspecified fall, initial encounter: Secondary | ICD-10-CM | POA: Diagnosis not present

## 2017-03-24 DIAGNOSIS — M84478D Pathological fracture, left toe(s), subsequent encounter for fracture with routine healing: Secondary | ICD-10-CM | POA: Diagnosis not present

## 2017-03-25 DIAGNOSIS — E785 Hyperlipidemia, unspecified: Secondary | ICD-10-CM | POA: Diagnosis not present

## 2017-03-25 DIAGNOSIS — Z1339 Encounter for screening examination for other mental health and behavioral disorders: Secondary | ICD-10-CM | POA: Diagnosis not present

## 2017-03-25 DIAGNOSIS — Z6832 Body mass index (BMI) 32.0-32.9, adult: Secondary | ICD-10-CM | POA: Diagnosis not present

## 2017-03-25 DIAGNOSIS — Z01818 Encounter for other preprocedural examination: Secondary | ICD-10-CM | POA: Diagnosis not present

## 2017-03-28 DIAGNOSIS — L97819 Non-pressure chronic ulcer of other part of right lower leg with unspecified severity: Secondary | ICD-10-CM | POA: Diagnosis not present

## 2017-03-28 DIAGNOSIS — W19XXXA Unspecified fall, initial encounter: Secondary | ICD-10-CM | POA: Diagnosis not present

## 2017-03-28 DIAGNOSIS — S81802A Unspecified open wound, left lower leg, initial encounter: Secondary | ICD-10-CM | POA: Diagnosis not present

## 2017-04-04 DIAGNOSIS — J449 Chronic obstructive pulmonary disease, unspecified: Secondary | ICD-10-CM | POA: Diagnosis not present

## 2017-04-04 DIAGNOSIS — E669 Obesity, unspecified: Secondary | ICD-10-CM | POA: Diagnosis not present

## 2017-04-04 DIAGNOSIS — S92352P Displaced fracture of fifth metatarsal bone, left foot, subsequent encounter for fracture with malunion: Secondary | ICD-10-CM | POA: Diagnosis not present

## 2017-04-04 DIAGNOSIS — Z6832 Body mass index (BMI) 32.0-32.9, adult: Secondary | ICD-10-CM | POA: Diagnosis not present

## 2017-04-04 DIAGNOSIS — S92355A Nondisplaced fracture of fifth metatarsal bone, left foot, initial encounter for closed fracture: Secondary | ICD-10-CM | POA: Diagnosis not present

## 2017-04-04 DIAGNOSIS — X58XXXA Exposure to other specified factors, initial encounter: Secondary | ICD-10-CM | POA: Diagnosis not present

## 2017-04-04 DIAGNOSIS — S92352A Displaced fracture of fifth metatarsal bone, left foot, initial encounter for closed fracture: Secondary | ICD-10-CM | POA: Diagnosis not present

## 2017-04-04 DIAGNOSIS — K219 Gastro-esophageal reflux disease without esophagitis: Secondary | ICD-10-CM | POA: Diagnosis not present

## 2017-04-04 DIAGNOSIS — I10 Essential (primary) hypertension: Secondary | ICD-10-CM | POA: Diagnosis not present

## 2017-04-08 DIAGNOSIS — M84478D Pathological fracture, left toe(s), subsequent encounter for fracture with routine healing: Secondary | ICD-10-CM | POA: Diagnosis not present

## 2017-04-24 DIAGNOSIS — S92355D Nondisplaced fracture of fifth metatarsal bone, left foot, subsequent encounter for fracture with routine healing: Secondary | ICD-10-CM | POA: Diagnosis not present

## 2017-05-08 DIAGNOSIS — S92355D Nondisplaced fracture of fifth metatarsal bone, left foot, subsequent encounter for fracture with routine healing: Secondary | ICD-10-CM | POA: Diagnosis not present

## 2017-05-26 DIAGNOSIS — S92355D Nondisplaced fracture of fifth metatarsal bone, left foot, subsequent encounter for fracture with routine healing: Secondary | ICD-10-CM | POA: Diagnosis not present

## 2017-05-27 DIAGNOSIS — L820 Inflamed seborrheic keratosis: Secondary | ICD-10-CM | POA: Diagnosis not present

## 2017-05-27 DIAGNOSIS — L821 Other seborrheic keratosis: Secondary | ICD-10-CM | POA: Diagnosis not present

## 2017-06-03 DIAGNOSIS — M722 Plantar fascial fibromatosis: Secondary | ICD-10-CM | POA: Insufficient documentation

## 2017-06-03 DIAGNOSIS — M6702 Short Achilles tendon (acquired), left ankle: Secondary | ICD-10-CM | POA: Diagnosis not present

## 2017-06-03 HISTORY — DX: Plantar fascial fibromatosis: M72.2

## 2017-06-11 DIAGNOSIS — J40 Bronchitis, not specified as acute or chronic: Secondary | ICD-10-CM | POA: Diagnosis not present

## 2017-06-11 DIAGNOSIS — Z6834 Body mass index (BMI) 34.0-34.9, adult: Secondary | ICD-10-CM | POA: Diagnosis not present

## 2017-06-11 DIAGNOSIS — J329 Chronic sinusitis, unspecified: Secondary | ICD-10-CM | POA: Diagnosis not present

## 2017-06-16 DIAGNOSIS — S92355D Nondisplaced fracture of fifth metatarsal bone, left foot, subsequent encounter for fracture with routine healing: Secondary | ICD-10-CM | POA: Diagnosis not present

## 2017-07-29 DIAGNOSIS — H25811 Combined forms of age-related cataract, right eye: Secondary | ICD-10-CM | POA: Diagnosis not present

## 2017-07-29 DIAGNOSIS — Z01818 Encounter for other preprocedural examination: Secondary | ICD-10-CM | POA: Diagnosis not present

## 2017-08-19 DIAGNOSIS — Z7722 Contact with and (suspected) exposure to environmental tobacco smoke (acute) (chronic): Secondary | ICD-10-CM | POA: Diagnosis not present

## 2017-08-19 DIAGNOSIS — H25811 Combined forms of age-related cataract, right eye: Secondary | ICD-10-CM | POA: Diagnosis not present

## 2017-08-19 DIAGNOSIS — Z79899 Other long term (current) drug therapy: Secondary | ICD-10-CM | POA: Diagnosis not present

## 2017-08-19 DIAGNOSIS — K219 Gastro-esophageal reflux disease without esophagitis: Secondary | ICD-10-CM | POA: Diagnosis not present

## 2017-08-19 DIAGNOSIS — I10 Essential (primary) hypertension: Secondary | ICD-10-CM | POA: Diagnosis not present

## 2017-08-19 DIAGNOSIS — H259 Unspecified age-related cataract: Secondary | ICD-10-CM | POA: Diagnosis not present

## 2017-08-19 DIAGNOSIS — J449 Chronic obstructive pulmonary disease, unspecified: Secondary | ICD-10-CM | POA: Diagnosis not present

## 2017-08-19 DIAGNOSIS — H52223 Regular astigmatism, bilateral: Secondary | ICD-10-CM | POA: Diagnosis not present

## 2017-08-26 DIAGNOSIS — M25512 Pain in left shoulder: Secondary | ICD-10-CM | POA: Diagnosis not present

## 2017-08-26 DIAGNOSIS — M7552 Bursitis of left shoulder: Secondary | ICD-10-CM | POA: Diagnosis not present

## 2017-09-09 DIAGNOSIS — M1990 Unspecified osteoarthritis, unspecified site: Secondary | ICD-10-CM | POA: Diagnosis not present

## 2017-09-09 DIAGNOSIS — I10 Essential (primary) hypertension: Secondary | ICD-10-CM | POA: Diagnosis not present

## 2017-09-09 DIAGNOSIS — K219 Gastro-esophageal reflux disease without esophagitis: Secondary | ICD-10-CM | POA: Diagnosis not present

## 2017-09-09 DIAGNOSIS — H25812 Combined forms of age-related cataract, left eye: Secondary | ICD-10-CM | POA: Diagnosis not present

## 2017-09-09 DIAGNOSIS — J449 Chronic obstructive pulmonary disease, unspecified: Secondary | ICD-10-CM | POA: Diagnosis not present

## 2017-09-09 DIAGNOSIS — Z79899 Other long term (current) drug therapy: Secondary | ICD-10-CM | POA: Diagnosis not present

## 2017-09-09 DIAGNOSIS — Z7722 Contact with and (suspected) exposure to environmental tobacco smoke (acute) (chronic): Secondary | ICD-10-CM | POA: Diagnosis not present

## 2017-09-17 DIAGNOSIS — K219 Gastro-esophageal reflux disease without esophagitis: Secondary | ICD-10-CM | POA: Diagnosis not present

## 2017-09-17 DIAGNOSIS — G4700 Insomnia, unspecified: Secondary | ICD-10-CM | POA: Diagnosis not present

## 2017-09-17 DIAGNOSIS — I5032 Chronic diastolic (congestive) heart failure: Secondary | ICD-10-CM | POA: Diagnosis not present

## 2017-09-17 DIAGNOSIS — Z6833 Body mass index (BMI) 33.0-33.9, adult: Secondary | ICD-10-CM | POA: Diagnosis not present

## 2017-09-17 DIAGNOSIS — N3281 Overactive bladder: Secondary | ICD-10-CM | POA: Diagnosis not present

## 2017-09-17 DIAGNOSIS — I5030 Unspecified diastolic (congestive) heart failure: Secondary | ICD-10-CM | POA: Diagnosis not present

## 2017-09-17 DIAGNOSIS — E669 Obesity, unspecified: Secondary | ICD-10-CM | POA: Diagnosis not present

## 2017-09-17 DIAGNOSIS — J449 Chronic obstructive pulmonary disease, unspecified: Secondary | ICD-10-CM | POA: Diagnosis not present

## 2017-09-17 DIAGNOSIS — I110 Hypertensive heart disease with heart failure: Secondary | ICD-10-CM | POA: Diagnosis not present

## 2017-09-17 DIAGNOSIS — G2581 Restless legs syndrome: Secondary | ICD-10-CM | POA: Diagnosis not present

## 2017-09-17 DIAGNOSIS — E785 Hyperlipidemia, unspecified: Secondary | ICD-10-CM | POA: Diagnosis not present

## 2017-11-20 DIAGNOSIS — I83018 Varicose veins of right lower extremity with ulcer other part of lower leg: Secondary | ICD-10-CM | POA: Diagnosis not present

## 2017-11-20 DIAGNOSIS — I10 Essential (primary) hypertension: Secondary | ICD-10-CM | POA: Diagnosis not present

## 2017-11-20 DIAGNOSIS — Z9289 Personal history of other medical treatment: Secondary | ICD-10-CM | POA: Diagnosis not present

## 2017-11-20 DIAGNOSIS — I872 Venous insufficiency (chronic) (peripheral): Secondary | ICD-10-CM | POA: Diagnosis not present

## 2017-11-26 DIAGNOSIS — D125 Benign neoplasm of sigmoid colon: Secondary | ICD-10-CM | POA: Diagnosis not present

## 2017-11-26 DIAGNOSIS — Z800 Family history of malignant neoplasm of digestive organs: Secondary | ICD-10-CM | POA: Diagnosis not present

## 2017-11-26 DIAGNOSIS — Z86010 Personal history of colonic polyps: Secondary | ICD-10-CM | POA: Diagnosis not present

## 2017-11-26 DIAGNOSIS — Z1211 Encounter for screening for malignant neoplasm of colon: Secondary | ICD-10-CM | POA: Diagnosis not present

## 2017-12-13 DIAGNOSIS — S99922A Unspecified injury of left foot, initial encounter: Secondary | ICD-10-CM | POA: Diagnosis not present

## 2017-12-17 DIAGNOSIS — I340 Nonrheumatic mitral (valve) insufficiency: Secondary | ICD-10-CM

## 2017-12-17 HISTORY — DX: Nonrheumatic mitral (valve) insufficiency: I34.0

## 2017-12-17 NOTE — Progress Notes (Deleted)
Cardiology Office Note:    Date:  12/18/2017   ID:  Kelsey Rivera, DOB [birth year], MRN [id number]  PCP:  No primary care provider on file.  Cardiologist:  Shirlee More, MD    Referring MD: No ref. provider found    ASSESSMENT:    1. Essential hypertension   2. LBBB (left bundle branch block)   3. Hyperlipidemia, unspecified hyperlipidemia type   4. Non-rheumatic mitral regurgitation    PLAN:    In order of problems listed above:  1. ***   Next appointment: ***   Medication Adjustments/Labs and Tests Ordered: Current medicines are reviewed at length with the patient today.  Concerns regarding medicines are outlined above.  No orders of the defined types were placed in this encounter.  No orders of the defined types were placed in this encounter.   No chief complaint on file.   History of Present Illness:    Kelsey Rivera is a 72 y.o. female with a hx of left bundle branch block hypertension moderate mitral regurgitation COPD hyperlipidemia and costochondral chest pain syndrome last seen ***. Compliance with diet, lifestyle and medications: *** Past Medical History:  Diagnosis Date  . Arthritis of foot, left 01/09/2016  . Baker's cyst of knee 02/06/2017  . Chest pain in adult 04/25/2015   Overview:  Lexiscan  MPS with normal perfusion and function, EF 52%  . Essential hypertension 04/26/2015  . Extensor tendonitis of foot 12/14/2015   Overview:  Left  . Hyperlipidemia 02/06/2017  . Injury of left foot 07/25/2015  . LBBB (left bundle branch block) 04/25/2015  . Migraine 02/06/2017  . Osteoarthritis 02/06/2017  . Osteoarthritis of left foot 11/14/2015  . Pathological fracture of metatarsal bone of left foot with routine healing 08/15/2015  . RLS (restless legs syndrome) 04/26/2015    Past Surgical History:  Procedure Laterality Date  . ABDOMINAL HYSTERECTOMY    . CHOLECYSTECTOMY    . TONSILLECTOMY      Current Medications: No outpatient medications  have been marked as taking for the 12/18/17 encounter (Appointment) with Richardo Priest, MD.     Allergies:   Sulfa antibiotics   Social History   Socioeconomic History  . Marital status: Not on file    Spouse name: Not on file  . Number of children: Not on file  . Years of education: Not on file  . Highest education level: Not on file  Occupational History  . Not on file  Social Needs  . Financial resource strain: Not on file  . Food insecurity:    Worry: Not on file    Inability: Not on file  . Transportation needs:    Medical: Not on file    Non-medical: Not on file  Tobacco Use  . Smoking status: Never Smoker  . Smokeless tobacco: Never Used  Substance and Sexual Activity  . Alcohol use: No  . Drug use: No  . Sexual activity: Not on file  Lifestyle  . Physical activity:    Days per week: Not on file    Minutes per session: Not on file  . Stress: Not on file  Relationships  . Social connections:    Talks on phone: Not on file    Gets together: Not on file    Attends religious service: Not on file    Active member of club or organization: Not on file    Attends meetings of clubs or organizations: Not on file    Relationship status: Not  on file  Other Topics Concern  . Not on file  Social History Narrative  . Not on file     Family History: The patient's ***family history includes CAD in her brother; Cancer in her brother; Heart attack in her father and mother; Pulmonary embolism in her brother. ROS:   Please see the history of present illness.    All other systems reviewed and are negative.  EKGs/Labs/Other Studies Reviewed:    The following studies were reviewed today:  EKG:  EKG ordered today.  The ekg ordered today demonstrates ***  Recent Labs: No results found for requested labs within last 8760 hours.  Recent Lipid Panel No results found for: CHOL, TRIG, HDL, CHOLHDL, VLDL, LDLCALC, LDLDIRECT  Physical Exam:    VS:  There were no vitals  taken for this visit.    Wt Readings from Last 3 Encounters:  No data found for Wt     GEN: *** Well nourished, well developed in no acute distress HEENT: Normal NECK: No JVD; No carotid bruits LYMPHATICS: No lymphadenopathy CARDIAC: ***RRR, no murmurs, rubs, gallops RESPIRATORY:  Clear to auscultation without rales, wheezing or rhonchi  ABDOMEN: Soft, non-tender, non-distended MUSCULOSKELETAL:  No edema; No deformity  SKIN: Warm and dry NEUROLOGIC:  Alert and oriented x 3 PSYCHIATRIC:  Normal affect    Signed, Shirlee More, MD  12/18/2017 7:36 AM    [place]

## 2017-12-18 ENCOUNTER — Ambulatory Visit: Payer: Self-pay | Admitting: Cardiology

## 2017-12-18 DIAGNOSIS — I83018 Varicose veins of right lower extremity with ulcer other part of lower leg: Secondary | ICD-10-CM | POA: Diagnosis not present

## 2017-12-18 DIAGNOSIS — I83813 Varicose veins of bilateral lower extremities with pain: Secondary | ICD-10-CM | POA: Diagnosis not present

## 2017-12-18 DIAGNOSIS — L97909 Non-pressure chronic ulcer of unspecified part of unspecified lower leg with unspecified severity: Secondary | ICD-10-CM | POA: Diagnosis not present

## 2017-12-18 DIAGNOSIS — I83028 Varicose veins of left lower extremity with ulcer other part of lower leg: Secondary | ICD-10-CM | POA: Diagnosis not present

## 2017-12-29 DIAGNOSIS — J039 Acute tonsillitis, unspecified: Secondary | ICD-10-CM | POA: Diagnosis not present

## 2018-01-06 DIAGNOSIS — I872 Venous insufficiency (chronic) (peripheral): Secondary | ICD-10-CM | POA: Diagnosis not present

## 2018-01-06 DIAGNOSIS — I83018 Varicose veins of right lower extremity with ulcer other part of lower leg: Secondary | ICD-10-CM | POA: Diagnosis not present

## 2018-02-02 DIAGNOSIS — I83028 Varicose veins of left lower extremity with ulcer other part of lower leg: Secondary | ICD-10-CM | POA: Diagnosis not present

## 2018-02-02 DIAGNOSIS — I83891 Varicose veins of right lower extremities with other complications: Secondary | ICD-10-CM | POA: Diagnosis not present

## 2018-02-02 DIAGNOSIS — I83018 Varicose veins of right lower extremity with ulcer other part of lower leg: Secondary | ICD-10-CM | POA: Diagnosis not present

## 2018-02-02 DIAGNOSIS — I82811 Embolism and thrombosis of superficial veins of right lower extremities: Secondary | ICD-10-CM | POA: Diagnosis not present

## 2018-02-02 DIAGNOSIS — I10 Essential (primary) hypertension: Secondary | ICD-10-CM | POA: Diagnosis not present

## 2018-02-10 DIAGNOSIS — I83028 Varicose veins of left lower extremity with ulcer other part of lower leg: Secondary | ICD-10-CM | POA: Diagnosis not present

## 2018-02-10 DIAGNOSIS — I8390 Asymptomatic varicose veins of unspecified lower extremity: Secondary | ICD-10-CM | POA: Diagnosis not present

## 2018-02-10 DIAGNOSIS — I83813 Varicose veins of bilateral lower extremities with pain: Secondary | ICD-10-CM | POA: Diagnosis not present

## 2018-02-10 DIAGNOSIS — I83018 Varicose veins of right lower extremity with ulcer other part of lower leg: Secondary | ICD-10-CM | POA: Diagnosis not present

## 2018-03-04 DIAGNOSIS — E785 Hyperlipidemia, unspecified: Secondary | ICD-10-CM | POA: Diagnosis not present

## 2018-03-04 DIAGNOSIS — Z0000 Encounter for general adult medical examination without abnormal findings: Secondary | ICD-10-CM | POA: Diagnosis not present

## 2018-03-04 DIAGNOSIS — J449 Chronic obstructive pulmonary disease, unspecified: Secondary | ICD-10-CM | POA: Diagnosis not present

## 2018-03-04 DIAGNOSIS — Z1339 Encounter for screening examination for other mental health and behavioral disorders: Secondary | ICD-10-CM | POA: Diagnosis not present

## 2018-03-04 DIAGNOSIS — Z1331 Encounter for screening for depression: Secondary | ICD-10-CM | POA: Diagnosis not present

## 2018-03-04 DIAGNOSIS — I5030 Unspecified diastolic (congestive) heart failure: Secondary | ICD-10-CM | POA: Diagnosis not present

## 2018-03-04 DIAGNOSIS — Z23 Encounter for immunization: Secondary | ICD-10-CM | POA: Diagnosis not present

## 2018-03-04 DIAGNOSIS — M810 Age-related osteoporosis without current pathological fracture: Secondary | ICD-10-CM | POA: Diagnosis not present

## 2018-03-04 DIAGNOSIS — I110 Hypertensive heart disease with heart failure: Secondary | ICD-10-CM | POA: Diagnosis not present

## 2018-03-04 DIAGNOSIS — G2581 Restless legs syndrome: Secondary | ICD-10-CM | POA: Diagnosis not present

## 2018-03-04 DIAGNOSIS — Z79899 Other long term (current) drug therapy: Secondary | ICD-10-CM | POA: Diagnosis not present

## 2018-03-04 DIAGNOSIS — G4700 Insomnia, unspecified: Secondary | ICD-10-CM | POA: Diagnosis not present

## 2018-03-20 DIAGNOSIS — I83891 Varicose veins of right lower extremities with other complications: Secondary | ICD-10-CM | POA: Diagnosis not present

## 2018-03-20 DIAGNOSIS — I83018 Varicose veins of right lower extremity with ulcer other part of lower leg: Secondary | ICD-10-CM | POA: Diagnosis not present

## 2018-03-20 DIAGNOSIS — Z8679 Personal history of other diseases of the circulatory system: Secondary | ICD-10-CM | POA: Diagnosis not present

## 2018-03-20 DIAGNOSIS — I83892 Varicose veins of left lower extremities with other complications: Secondary | ICD-10-CM | POA: Diagnosis not present

## 2018-03-20 DIAGNOSIS — I83028 Varicose veins of left lower extremity with ulcer other part of lower leg: Secondary | ICD-10-CM | POA: Diagnosis not present

## 2018-03-20 DIAGNOSIS — I83813 Varicose veins of bilateral lower extremities with pain: Secondary | ICD-10-CM | POA: Diagnosis not present

## 2018-04-02 DIAGNOSIS — Z1231 Encounter for screening mammogram for malignant neoplasm of breast: Secondary | ICD-10-CM | POA: Diagnosis not present

## 2018-04-04 DIAGNOSIS — M1990 Unspecified osteoarthritis, unspecified site: Secondary | ICD-10-CM | POA: Diagnosis not present

## 2018-04-04 DIAGNOSIS — I110 Hypertensive heart disease with heart failure: Secondary | ICD-10-CM | POA: Diagnosis not present

## 2018-04-04 DIAGNOSIS — R609 Edema, unspecified: Secondary | ICD-10-CM | POA: Diagnosis not present

## 2018-04-04 DIAGNOSIS — Z79899 Other long term (current) drug therapy: Secondary | ICD-10-CM | POA: Diagnosis not present

## 2018-04-04 DIAGNOSIS — I517 Cardiomegaly: Secondary | ICD-10-CM | POA: Diagnosis not present

## 2018-04-04 DIAGNOSIS — R0602 Shortness of breath: Secondary | ICD-10-CM | POA: Diagnosis not present

## 2018-04-04 DIAGNOSIS — I509 Heart failure, unspecified: Secondary | ICD-10-CM | POA: Diagnosis not present

## 2018-04-04 DIAGNOSIS — J449 Chronic obstructive pulmonary disease, unspecified: Secondary | ICD-10-CM | POA: Diagnosis not present

## 2018-04-06 DIAGNOSIS — L03115 Cellulitis of right lower limb: Secondary | ICD-10-CM | POA: Diagnosis not present

## 2018-04-06 DIAGNOSIS — M545 Low back pain: Secondary | ICD-10-CM | POA: Diagnosis not present

## 2018-04-06 DIAGNOSIS — I129 Hypertensive chronic kidney disease with stage 1 through stage 4 chronic kidney disease, or unspecified chronic kidney disease: Secondary | ICD-10-CM | POA: Diagnosis not present

## 2018-04-06 DIAGNOSIS — L03116 Cellulitis of left lower limb: Secondary | ICD-10-CM | POA: Diagnosis not present

## 2018-04-06 DIAGNOSIS — N184 Chronic kidney disease, stage 4 (severe): Secondary | ICD-10-CM | POA: Diagnosis not present

## 2018-04-06 DIAGNOSIS — R112 Nausea with vomiting, unspecified: Secondary | ICD-10-CM | POA: Diagnosis not present

## 2018-04-06 DIAGNOSIS — R600 Localized edema: Secondary | ICD-10-CM | POA: Diagnosis not present

## 2018-04-08 ENCOUNTER — Other Ambulatory Visit: Payer: Self-pay

## 2018-04-08 NOTE — Patient Outreach (Signed)
[place] [facility]) Care Management  04/08/2018  Kelsey Rivera February 23, 1946 [id number]   Telephone Screen  Referral Date: 04/08/18 Referral Source: [facility] Dept-Urgent Referral Reason: " member's brother and husband passed away within months of each other, one in May and the other in [place] requested assistance with grief counseling" Insurance: [employer]   Outreach attempt # 1 to patient. Spoke with patient and she voiced that she did not have time to talk at present and requested a call back another day.     Plan: RN CM will make outreach attempt to patient within 3-4 business days.   Enzo Montgomery, RN,BSN,CCM [place] Management Telephonic Care Management Coordinator Direct Phone: [phone] Toll Free: [phone] Fax: [phone]

## 2018-04-09 ENCOUNTER — Other Ambulatory Visit: Payer: Self-pay

## 2018-04-09 DIAGNOSIS — L853 Xerosis cutis: Secondary | ICD-10-CM | POA: Diagnosis not present

## 2018-04-09 DIAGNOSIS — L570 Actinic keratosis: Secondary | ICD-10-CM | POA: Diagnosis not present

## 2018-04-09 DIAGNOSIS — L299 Pruritus, unspecified: Secondary | ICD-10-CM | POA: Diagnosis not present

## 2018-04-09 DIAGNOSIS — I8310 Varicose veins of unspecified lower extremity with inflammation: Secondary | ICD-10-CM | POA: Diagnosis not present

## 2018-04-09 NOTE — Patient Outreach (Signed)
[place] [facility]) Care Management  04/09/2018  DEWAYNE JUREK [birth year] [id number]    Telephone Screen  Referral Date: 04/08/18 Referral Source: [facility] Dept-Urgent Referral Reason: " member's brother and husband passed away within months of each other, one in May and the other in [place] requested assistance with grief counseling" Insurance: [employer]  Outreach attempt #2 to patient. Spoke with patient and screening completed. Patient reports that she lives alone. However, her daughter is temporarily staying with her. Patient becomes tearful as she voices that her daughter suffers from drug addiction problems. She reports that patient is currently getting treatments. Patient has to drive her to appts so it is easier for her to stay with her right now. Patient voices that she lost both her brother and husband this year and shares how difficult life has been without them. She reports that she has "not had time to grieve" as she is too busy caring and worried about her daughter. PHQ9 completed. Patient scored 10. She states that she takes Valium as needed for her anxiety. However, she is interested in resources and info on grieving and counseling services. She states she is currently not getting medical treatment for her depression.Patient voices that she is independent with ADLs/IADLs. She denies any recent falls. She reports no assistive device usage.   Conditions: Per chart review, patient has PMH of GERD,HTN,OA and hysterectomy. Of note, she was in the ED on 04/06/18 for cellulitis and is currently being treated for it. Patient denies needing further RN assistance in managing her conditions. She states that she just wants help with her anxiety/depression issues. She denies any suicidal ideation at this time.   Medications: Patient states that she only takes a few meds-about five. She voices no issues or concerns regarding affording and/or managing meds.   Consent: [facility]  services reviewed and discussed with patient. She gave verbal consent for services and is agreeable to referral to [employer]: RN CM will make [facility] SW referral for grief support resources/counseling assistance.   Enzo Montgomery, RN,BSN,CCM [place] Management Telephonic Care Management Coordinator Direct Phone: [phone] Toll Free: [phone] Fax: [phone]

## 2018-04-10 ENCOUNTER — Ambulatory Visit: Payer: Self-pay

## 2018-04-10 DIAGNOSIS — I890 Lymphedema, not elsewhere classified: Secondary | ICD-10-CM | POA: Diagnosis not present

## 2018-04-10 DIAGNOSIS — L309 Dermatitis, unspecified: Secondary | ICD-10-CM | POA: Diagnosis not present

## 2018-04-10 DIAGNOSIS — Z872 Personal history of diseases of the skin and subcutaneous tissue: Secondary | ICD-10-CM | POA: Diagnosis not present

## 2018-04-10 DIAGNOSIS — Z09 Encounter for follow-up examination after completed treatment for conditions other than malignant neoplasm: Secondary | ICD-10-CM | POA: Diagnosis not present

## 2018-04-10 DIAGNOSIS — Z85828 Personal history of other malignant neoplasm of skin: Secondary | ICD-10-CM | POA: Diagnosis not present

## 2018-04-13 ENCOUNTER — Other Ambulatory Visit: Payer: Self-pay | Admitting: *Deleted

## 2018-04-13 DIAGNOSIS — I83018 Varicose veins of right lower extremity with ulcer other part of lower leg: Secondary | ICD-10-CM | POA: Diagnosis not present

## 2018-04-13 DIAGNOSIS — M7989 Other specified soft tissue disorders: Secondary | ICD-10-CM | POA: Diagnosis not present

## 2018-04-13 DIAGNOSIS — L97919 Non-pressure chronic ulcer of unspecified part of right lower leg with unspecified severity: Secondary | ICD-10-CM | POA: Diagnosis not present

## 2018-04-13 DIAGNOSIS — I83813 Varicose veins of bilateral lower extremities with pain: Secondary | ICD-10-CM | POA: Diagnosis not present

## 2018-04-13 DIAGNOSIS — I83891 Varicose veins of right lower extremities with other complications: Secondary | ICD-10-CM | POA: Diagnosis not present

## 2018-04-13 DIAGNOSIS — L97929 Non-pressure chronic ulcer of unspecified part of left lower leg with unspecified severity: Secondary | ICD-10-CM | POA: Diagnosis not present

## 2018-04-13 DIAGNOSIS — I83028 Varicose veins of left lower extremity with ulcer other part of lower leg: Secondary | ICD-10-CM | POA: Diagnosis not present

## 2018-04-13 NOTE — Patient Outreach (Signed)
[place] [facility]) Care Management  04/13/2018  MARENA WITTS Sep 18, 1945 [id number]   CSW attempted to make initial phone contact with pt today unsuccessfully. CSW left a HIPPA compliant voice message and will try again in the next 3-4 days.  CSW will send pt an Unsuccessful Outreach letter by mail as well.    Eduard Clos, MSW, [place] Worker  [place] [phone]

## 2018-04-14 DIAGNOSIS — R600 Localized edema: Secondary | ICD-10-CM | POA: Diagnosis not present

## 2018-04-14 DIAGNOSIS — F4322 Adjustment disorder with anxiety: Secondary | ICD-10-CM | POA: Diagnosis not present

## 2018-04-14 DIAGNOSIS — Z6835 Body mass index (BMI) 35.0-35.9, adult: Secondary | ICD-10-CM | POA: Diagnosis not present

## 2018-04-14 DIAGNOSIS — I5189 Other ill-defined heart diseases: Secondary | ICD-10-CM | POA: Diagnosis not present

## 2018-04-14 DIAGNOSIS — N183 Chronic kidney disease, stage 3 (moderate): Secondary | ICD-10-CM | POA: Diagnosis not present

## 2018-04-20 ENCOUNTER — Other Ambulatory Visit: Payer: PPO | Admitting: *Deleted

## 2018-04-21 NOTE — Patient Outreach (Signed)
[place] [facility]) Care Management  04/21/2018  KRISTY SCHOMBURG 06/28/1945 [id number]   CSW spoke with pt who reports looking forward to spending time with family over the holidays- "I am going to visit my mother in law and sister in law and also with family. She has not received mailed resouces yet and CSW will plan a f/u call in the next 7-10 days for follow up and further support/services.  Pt reports less tearful but also acknowledges the holidays are hard.  Emotional support offered.  Eduard Clos, MSW, [place] Worker  [place] [phone]

## 2018-04-27 ENCOUNTER — Other Ambulatory Visit: Payer: Self-pay | Admitting: *Deleted

## 2018-04-27 NOTE — Patient Outreach (Signed)
[place] [facility]) Care Management  04/27/2018  Kelsey Rivera Feb 22, 1946 [id number]  BSW mailed information regarding grief support groups offered by hospice [facility] [place] as requested by CSW Eduard Clos.  Daneen Schick, BSW, CDP [facility] [phone]

## 2018-04-27 NOTE — Patient Outreach (Signed)
[place] [facility]) Care Management  04/27/2018  Kelsey Rivera 1946/01/08 [id number]   CSW spoke with pt today who reports she is doing ok- the holidays appear to have gone ok an she is still interested in pursuing grief counseling. CSW encouraged pt to reach out to [facility] to find out more about their bereavement and grief counseling/support. CSW offered to follow up by phone next week for updates and further assessment and support.   Eduard Clos, MSW, [place] Worker  [place] [phone]

## 2018-05-04 ENCOUNTER — Other Ambulatory Visit: Payer: Self-pay | Admitting: *Deleted

## 2018-05-04 DIAGNOSIS — Z6833 Body mass index (BMI) 33.0-33.9, adult: Secondary | ICD-10-CM | POA: Diagnosis not present

## 2018-05-04 DIAGNOSIS — F4322 Adjustment disorder with anxiety: Secondary | ICD-10-CM | POA: Diagnosis not present

## 2018-05-04 DIAGNOSIS — J0101 Acute recurrent maxillary sinusitis: Secondary | ICD-10-CM | POA: Diagnosis not present

## 2018-05-04 DIAGNOSIS — I5032 Chronic diastolic (congestive) heart failure: Secondary | ICD-10-CM | POA: Diagnosis not present

## 2018-05-05 ENCOUNTER — Other Ambulatory Visit: Payer: Self-pay | Admitting: *Deleted

## 2018-05-05 NOTE — Patient Outreach (Signed)
[place] [facility]) Care Management  05/05/2018  Kelsey Rivera [birth year] [id number]   CSW made contact with pt today by phone. Identity confirmed and pt indicates she has received the info for grief counseling and hopes to look into more this week or next. She is interested in one on one therapy and is aware of [facility] in [place] free programs for individual and group support.  Pt denies any current issues or concerns; states all is well with her family aside from her daughter having seizures which has involved her being a caregiver for her.  CSW encouraged pt to find time for herself and her needs also. "I am gonna try to get with Hospice this week or next". Pt CSW requests follow up call towards end of month for followup.   Eduard Clos, MSW, [place] Worker  [place] [phone]

## 2018-05-15 ENCOUNTER — Ambulatory Visit (INDEPENDENT_AMBULATORY_CARE_PROVIDER_SITE_OTHER): Payer: PPO | Admitting: Cardiology

## 2018-05-15 ENCOUNTER — Encounter: Payer: Self-pay | Admitting: Cardiology

## 2018-05-15 VITALS — BP 144/66 | HR 64 | Ht 60.0 in | Wt 174.0 lb

## 2018-05-15 DIAGNOSIS — I340 Nonrheumatic mitral (valve) insufficiency: Secondary | ICD-10-CM | POA: Diagnosis not present

## 2018-05-15 DIAGNOSIS — I447 Left bundle-branch block, unspecified: Secondary | ICD-10-CM | POA: Diagnosis not present

## 2018-05-15 DIAGNOSIS — I10 Essential (primary) hypertension: Secondary | ICD-10-CM

## 2018-05-15 MED ORDER — HYDRALAZINE HCL 25 MG PO TABS: 12.5000 mg | ORAL_TABLET | Freq: Three times a day (TID) | ORAL | 5 refills | Status: DC

## 2018-05-15 NOTE — Progress Notes (Signed)
Cardiology Office Note:    Date:  05/15/2018   ID:  Kelsey Rivera, DOB Mar 07, 1946, MRN [id number]  PCP:  Street, Sharon Mt, MD  Cardiologist:  Shirlee More, MD    Referring MD: No ref. provider found    ASSESSMENT:    1. LBBB (left bundle branch block)   2. Essential hypertension   3. Nonrheumatic mitral valve regurgitation    PLAN:    In order of problems listed above:  1. Stable EKG pattern at risk for cardiomyopathy recheck echocardiogram clinically I do not feel she has heart failure 2. Her hypertension is complicated by CKD it appears to have improved recently advised her to follow-up with the nephrologist and let them adjust her blood pressure medication after today.  She will stop the calcium channel blocker with severe peripheral edema and place her on hydralazine and continue her thiazide diuretic.  I did not give her a beta-blocker with resting heart rate in the low 60s and additional agent was needed centrally active clonidine low-dose may be a good choice 3. Recheck echocardiogram clinically I do not feel she has severe mitral regurgitation   Next appointment: 1 year or sooner if there echocardiogram is abnormal   Medication Adjustments/Labs and Tests Ordered: Current medicines are reviewed at length with the patient today.  Concerns regarding medicines are outlined above.  No orders of the defined types were placed in this encounter.  No orders of the defined types were placed in this encounter.   Chief Complaint  Patient presents with  . Follow-up    LBBB  . Hypertension  . Mitral Regurgitation    History of Present Illness:    Kelsey Rivera is a 73 y.o. female with a hx of left bundle branch block, hypertension and  moderate mitral regurgitation last seen by me at [facility] cardiology 08/01/2016. Compliance with diet, lifestyle and medications: Yes  From my office note 05/24/15:   Test Results stress MIBI  done recently was Normal Echo  with normal EF%, moderate MR  Recently she stopped with exacerbation of asthma she had congestive heart failure.  I had seen her and her problems included left bundle branch block with normal left ventricular function moderate mitral regurgitation hypertension but I researched her records and I find no documentation of heart failure.  She has marked peripheral edema taking high-dose calcium channel blocker we decided to stop and substitute hydralazine and she is seeing a nephrologist in a few weeks as she is quickly developed stage IV CKD in 1 year.  She is not short of breath she has dependent edema worse during the day no orthopnea chest pain palpitation or syncope.  Recent labs performed with her primary care physician show a creatinine of 1.412 1719 cholesterol 195 LDL 124.  Her last evaluation cardiac function was 3 years ago.  I did advise her that she is at risk of developing cardiomyopathy and left bundle branch block progression of mitral regurgitation undergo repeat echocardiogram and she agrees I will plan to see her routinely in 1 year but if she has high risk markers on her echocardiogram will bring her back to my office.  I do not think she needs a repeat ischemia evaluation  Past Medical History:  Diagnosis Date  . Arthritis of foot, left 01/09/2016  . Baker's cyst of knee 02/06/2017  . Chest pain in adult 04/25/2015   Overview:  Lexiscan  MPS with normal perfusion and function, EF 52%  . Essential hypertension  04/26/2015  . Extensor tendonitis of foot 12/14/2015   Overview:  Left  . Hyperlipidemia 02/06/2017  . Injury of left foot 07/25/2015  . LBBB (left bundle branch block) 04/25/2015  . Migraine 02/06/2017  . Osteoarthritis 02/06/2017  . Osteoarthritis of left foot 11/14/2015  . Pathological fracture of metatarsal bone of left foot with routine healing 08/15/2015  . RLS (restless legs syndrome) 04/26/2015    Past Surgical History:  Procedure Laterality Date  . ABDOMINAL  HYSTERECTOMY    . CHOLECYSTECTOMY    . FOOT FRACTURE SURGERY    . TONSILLECTOMY    . VEIN SURGERY      Current Medications: Current Meds  Medication Sig  . amLODipine (NORVASC) 10 MG tablet Take 10 mg by mouth daily.  . Calcium Carbonate-Vitamin D 600-400 MG-UNIT tablet TAKE 2 (TWO) TABLET BY MOUTH DAILY FOR OSTEOPENIA  . diazepam (VALIUM) 10 MG tablet TAKE 1 TABLET BY MOUTH AT BEDTIME EVERY OTHER NIGHT FOR ANXIETY  . escitalopram (LEXAPRO) 5 MG tablet Take 5 mg by mouth daily.  Marland Kitchen esomeprazole (NEXIUM) 20 MG capsule Take 20 mg by mouth daily.   Marland Kitchen gabapentin (NEURONTIN) 300 MG capsule take 1 capsule by mouth at bedtime  . hydrochlorothiazide (HYDRODIURIL) 25 MG tablet TAKE 1 TABLET BY MOUTH EVERY DAY  . methocarbamol (ROBAXIN) 500 MG tablet TAKE 2 (TWO) TABLET THREE TIMES DAILY AS NEEDED FOR BACK SPASMS  . rOPINIRole (REQUIP) 4 MG tablet Take 2 tablets by mouth daily.  Marland Kitchen zolpidem (AMBIEN) 5 MG tablet Take 5 mg by mouth at bedtime as needed.      Allergies:   Sulfa antibiotics   Social History   Socioeconomic History  . Marital status: Married    Spouse name: Not on file  . Number of children: Not on file  . Years of education: Not on file  . Highest education level: Not on file  Occupational History  . Not on file  Social Needs  . Financial resource strain: Not on file  . Food insecurity:    Worry: Not on file    Inability: Not on file  . Transportation needs:    Medical: Not on file    Non-medical: Not on file  Tobacco Use  . Smoking status: Never Smoker  . Smokeless tobacco: Never Used  Substance and Sexual Activity  . Alcohol use: No  . Drug use: No  . Sexual activity: Not on file  Lifestyle  . Physical activity:    Days per week: Not on file    Minutes per session: Not on file  . Stress: Not on file  Relationships  . Social connections:    Talks on phone: Not on file    Gets together: Not on file    Attends religious service: Not on file    Active member  of club or organization: Not on file    Attends meetings of clubs or organizations: Not on file    Relationship status: Not on file  Other Topics Concern  . Not on file  Social History Narrative  . Not on file     Family History: The patient's family history includes CAD in her brother; Cancer in her brother; Heart attack in her father and mother; Pulmonary embolism in her brother. ROS:   Please see the history of present illness.    All other systems reviewed and are negative.  EKGs/Labs/Other Studies Reviewed:    The following studies were reviewed today:  EKG:  EKG ordered today.  The ekg ordered today demonstrates sinus rhythm left bundle branch block  Recent Labs:   CMP 04/06/2018 creatinine 2.0 potassium 3.9 GFR 24 cc/min hemoglobin 12.3 No results found for requested labs within last 8760 hours.  Recent Lipid Panel No results found for: CHOL, TRIG, HDL, CHOLHDL, VLDL, LDLCALC, LDLDIRECT  Physical Exam:    VS:  Ht 5' (1.524 m)   Wt 174 lb (78.9 kg)   BMI 33.98 kg/m     Wt Readings from Last 3 Encounters:  05/15/18 174 lb (78.9 kg)     GEN:  Well nourished, well developed in no acute distress HEENT: Normal NECK: No JVD; No carotid bruits LYMPHATICS: No lymphadenopathy CARDIAC: 1/6 murmur of MR RRR, no murmurs, rubs, gallops RESPIRATORY:  Clear to auscultation without rales, wheezing or rhonchi  ABDOMEN: Soft, non-tender, non-distended MUSCULOSKELETAL: Bilateral to the knee doughy edema; No deformity  SKIN: Warm and dry NEUROLOGIC:  Alert and oriented x 3 PSYCHIATRIC:  Normal affect    Signed, Shirlee More, MD  05/15/2018 10:25 AM    [place]

## 2018-05-15 NOTE — Patient Instructions (Signed)
Medication Instructions:  Your physician has recommended you make the following change in your medication:   STOP amlodipine  START hydralazine (apresoline) 25 mg: Take 0.5 tablet (12.5 mg) three times daily  If you need a refill on your cardiac medications before your next appointment, please call your pharmacy.   Lab work: None  If you have labs (blood work) drawn today and your tests are completely normal, you will receive your results only by: Marland Kitchen MyChart Message (if you have MyChart) OR . A paper copy in the mail If you have any lab test that is abnormal or we need to change your treatment, we will call you to review the results.  Testing/Procedures: You had an EKG today.   Follow-Up: At [facility], you and your health needs are our priority.  As part of our continuing mission to provide you with exceptional heart care, we have created designated Provider Care Teams.  These Care Teams include your primary Cardiologist (physician) and Advanced Practice Providers (APPs -  Physician Assistants and Nurse Practitioners) who all work together to provide you with the care you need, when you need it. You will need a follow up appointment in 1 years.  Please call our office 2 months in advance to schedule this appointment.      Hydralazine tablets What is this medicine? HYDRALAZINE (hye DRAL a zeen) is a type of vasodilator. It relaxes blood vessels, increasing the blood and oxygen supply to your heart. This medicine is used to treat high blood pressure. This medicine may be used for other purposes; ask your health care provider or pharmacist if you have questions. COMMON BRAND NAME(S): Apresoline What should I tell my health care provider before I take this medicine? They need to know if you have any of these conditions: -blood vessel disease -heart disease including angina or history of heart attack -kidney or liver disease -systemic lupus erythematosus (SLE) -an unusual or  allergic reaction to hydralazine, tartrazine dye, other medicines, foods, dyes, or preservatives -pregnant or trying to get pregnant -breast-feeding How should I use this medicine? Take this medicine by mouth with a glass of water. Follow the directions on the prescription label. Take your doses at regular intervals. Do not take your medicine more often than directed. Do not stop taking except on the advice of your doctor or health care professional. Talk to your pediatrician regarding the use of this medicine in children. Special care may be needed. While this drug may be prescribed for children for selected conditions, precautions do apply. Overdosage: If you think you have taken too much of this medicine contact a poison control center or emergency room at once. NOTE: This medicine is only for you. Do not share this medicine with others. What if I miss a dose? If you miss a dose, take it as soon as you can. If it is almost time for your next dose, take only that dose. Do not take double or extra doses. What may interact with this medicine? -medicines for high blood pressure -medicines for mental depression This list may not describe all possible interactions. Give your health care provider a list of all the medicines, herbs, non-prescription drugs, or dietary supplements you use. Also tell them if you smoke, drink alcohol, or use illegal drugs. Some items may interact with your medicine. What should I watch for while using this medicine? Visit your doctor or health care professional for regular checks on your progress. Check your blood pressure and pulse rate  regularly. Ask your doctor or health care professional what your blood pressure and pulse rate should be and when you should contact him or her. You may get drowsy or dizzy. Do not drive, use machinery, or do anything that needs mental alertness until you know how this medicine affects you. Do not stand or sit up quickly, especially if you  are an older patient. This reduces the risk of dizzy or fainting spells. Alcohol may interfere with the effect of this medicine. Avoid alcoholic drinks. Do not treat yourself for coughs, colds, or pain while you are taking this medicine without asking your doctor or health care professional for advice. Some ingredients may increase your blood pressure. What side effects may I notice from receiving this medicine? Side effects that you should report to your doctor or health care professional as soon as possible: -chest pain, or fast or irregular heartbeat -fever, chills, or sore throat -numbness or tingling in the hands or feet -shortness of breath -skin rash, redness, blisters or itching -stiff or swollen joints -sudden weight gain -swelling of the feet or legs -swollen lymph glands -unusual weakness Side effects that usually do not require medical attention (report to your doctor or health care professional if they continue or are bothersome): -diarrhea, or constipation -headache -loss of appetite -nausea, vomiting This list may not describe all possible side effects. Call your doctor for medical advice about side effects. You may report side effects to FDA at 1-800-FDA-1088. Where should I keep my medicine? Keep out of the reach of children. Store at room temperature between 15 and 30 degrees C (59 and 86 degrees F). Throw away any unused medicine after the expiration date. NOTE: This sheet is a summary. It may not cover all possible information. If you have questions about this medicine, talk to your doctor, pharmacist, or health care provider.  2019 Elsevier/Gold Standard (2007-08-28 15:44:58)

## 2018-05-19 DIAGNOSIS — I872 Venous insufficiency (chronic) (peripheral): Secondary | ICD-10-CM | POA: Diagnosis not present

## 2018-05-19 DIAGNOSIS — I83813 Varicose veins of bilateral lower extremities with pain: Secondary | ICD-10-CM | POA: Diagnosis not present

## 2018-05-19 DIAGNOSIS — I82812 Embolism and thrombosis of superficial veins of left lower extremities: Secondary | ICD-10-CM | POA: Diagnosis not present

## 2018-05-19 DIAGNOSIS — I83028 Varicose veins of left lower extremity with ulcer other part of lower leg: Secondary | ICD-10-CM | POA: Diagnosis not present

## 2018-05-19 DIAGNOSIS — I83018 Varicose veins of right lower extremity with ulcer other part of lower leg: Secondary | ICD-10-CM | POA: Diagnosis not present

## 2018-05-20 DIAGNOSIS — R600 Localized edema: Secondary | ICD-10-CM | POA: Insufficient documentation

## 2018-05-20 DIAGNOSIS — N183 Chronic kidney disease, stage 3 unspecified: Secondary | ICD-10-CM

## 2018-05-20 DIAGNOSIS — N179 Acute kidney failure, unspecified: Secondary | ICD-10-CM | POA: Insufficient documentation

## 2018-05-20 DIAGNOSIS — I5032 Chronic diastolic (congestive) heart failure: Secondary | ICD-10-CM | POA: Insufficient documentation

## 2018-05-20 DIAGNOSIS — I340 Nonrheumatic mitral (valve) insufficiency: Secondary | ICD-10-CM | POA: Diagnosis not present

## 2018-05-20 HISTORY — DX: Localized edema: R60.0

## 2018-05-20 HISTORY — DX: Acute kidney failure, unspecified: N17.9

## 2018-05-20 HISTORY — DX: Chronic diastolic (congestive) heart failure: I50.32

## 2018-05-20 HISTORY — DX: Chronic kidney disease, stage 3 unspecified: N18.30

## 2018-05-25 ENCOUNTER — Other Ambulatory Visit: Payer: Self-pay | Admitting: *Deleted

## 2018-05-25 NOTE — Patient Outreach (Signed)
[facility]) Care Management  05/25/2018  Kelsey Rivera 1946-03-01 [id number]  CSW spoke with pt by phone today- she confirmed her identity and shared with CSW that she is still "looking after my daughter who has seizures". They are planning for her daughter to be seen by Neurologist in February and then may be able to move into another home/setting.  Pt is open to info on resources for in home mental health support- CSW will mail info and make referral as pt agrees. CSW will plan f/u call in 1-2 weeks regarding above.    Eduard Clos, MSW, [place] Worker  [place] [phone]

## 2018-05-27 NOTE — Patient Outreach (Signed)
[facility]) Care Management  05/27/2018  Kelsey Rivera 09-Apr-1946 [id number]  BSW mailed resources as requested by CSW Eduard Clos.  Daneen Schick, BSW, CDP [facility] [phone]

## 2018-06-08 ENCOUNTER — Ambulatory Visit: Payer: Self-pay | Admitting: *Deleted

## 2018-06-08 ENCOUNTER — Other Ambulatory Visit: Payer: Self-pay | Admitting: *Deleted

## 2018-06-08 NOTE — Patient Outreach (Signed)
[place] [facility]) Care Management  06/08/2018  Kelsey Rivera 1945/11/23 [id number]   CSW attempted to reach pt by phone today unsuccessfully. CSW will try again later this week.    Eduard Clos, MSW, [place] Worker  [place] [phone]

## 2018-06-09 DIAGNOSIS — I5030 Unspecified diastolic (congestive) heart failure: Secondary | ICD-10-CM | POA: Diagnosis not present

## 2018-06-09 DIAGNOSIS — E669 Obesity, unspecified: Secondary | ICD-10-CM | POA: Diagnosis not present

## 2018-06-09 DIAGNOSIS — I110 Hypertensive heart disease with heart failure: Secondary | ICD-10-CM | POA: Diagnosis not present

## 2018-06-09 DIAGNOSIS — N183 Chronic kidney disease, stage 3 (moderate): Secondary | ICD-10-CM | POA: Diagnosis not present

## 2018-06-09 DIAGNOSIS — J449 Chronic obstructive pulmonary disease, unspecified: Secondary | ICD-10-CM | POA: Diagnosis not present

## 2018-06-09 DIAGNOSIS — Z6833 Body mass index (BMI) 33.0-33.9, adult: Secondary | ICD-10-CM | POA: Diagnosis not present

## 2018-06-09 DIAGNOSIS — I5032 Chronic diastolic (congestive) heart failure: Secondary | ICD-10-CM | POA: Diagnosis not present

## 2018-06-09 DIAGNOSIS — J40 Bronchitis, not specified as acute or chronic: Secondary | ICD-10-CM | POA: Diagnosis not present

## 2018-06-11 ENCOUNTER — Ambulatory Visit: Payer: Self-pay | Admitting: *Deleted

## 2018-06-11 ENCOUNTER — Encounter: Payer: Self-pay | Admitting: *Deleted

## 2018-06-11 DIAGNOSIS — R51 Headache: Secondary | ICD-10-CM | POA: Diagnosis not present

## 2018-06-11 DIAGNOSIS — I10 Essential (primary) hypertension: Secondary | ICD-10-CM | POA: Diagnosis not present

## 2018-06-11 DIAGNOSIS — R0609 Other forms of dyspnea: Secondary | ICD-10-CM | POA: Diagnosis not present

## 2018-06-11 DIAGNOSIS — R0600 Dyspnea, unspecified: Secondary | ICD-10-CM | POA: Diagnosis not present

## 2018-06-11 DIAGNOSIS — R918 Other nonspecific abnormal finding of lung field: Secondary | ICD-10-CM | POA: Diagnosis not present

## 2018-06-11 DIAGNOSIS — R7989 Other specified abnormal findings of blood chemistry: Secondary | ICD-10-CM | POA: Diagnosis not present

## 2018-06-11 DIAGNOSIS — J440 Chronic obstructive pulmonary disease with acute lower respiratory infection: Secondary | ICD-10-CM | POA: Diagnosis not present

## 2018-06-11 DIAGNOSIS — G43909 Migraine, unspecified, not intractable, without status migrainosus: Secondary | ICD-10-CM | POA: Diagnosis not present

## 2018-06-12 DIAGNOSIS — N183 Chronic kidney disease, stage 3 (moderate): Secondary | ICD-10-CM | POA: Diagnosis not present

## 2018-06-12 DIAGNOSIS — I5032 Chronic diastolic (congestive) heart failure: Secondary | ICD-10-CM | POA: Diagnosis not present

## 2018-06-12 DIAGNOSIS — I110 Hypertensive heart disease with heart failure: Secondary | ICD-10-CM | POA: Diagnosis not present

## 2018-06-12 DIAGNOSIS — Z6832 Body mass index (BMI) 32.0-32.9, adult: Secondary | ICD-10-CM | POA: Diagnosis not present

## 2018-06-12 DIAGNOSIS — J449 Chronic obstructive pulmonary disease, unspecified: Secondary | ICD-10-CM | POA: Diagnosis not present

## 2018-06-12 DIAGNOSIS — I5033 Acute on chronic diastolic (congestive) heart failure: Secondary | ICD-10-CM | POA: Diagnosis not present

## 2018-06-12 DIAGNOSIS — J441 Chronic obstructive pulmonary disease with (acute) exacerbation: Secondary | ICD-10-CM | POA: Diagnosis not present

## 2018-06-12 DIAGNOSIS — I5030 Unspecified diastolic (congestive) heart failure: Secondary | ICD-10-CM | POA: Diagnosis not present

## 2018-06-19 ENCOUNTER — Other Ambulatory Visit: Payer: Self-pay | Admitting: *Deleted

## 2018-06-20 NOTE — Patient Outreach (Signed)
[place]) Care Management  06/20/2018  ODILIA DAMICO 09-20-1945 [id number]   CSW was able to reach pt by phone on 06/19/2018. Pt reports she is ready to seek grief counseling support and wants "one on one" sessions.  CSW reminded her of the free grief support offered at [facility] and that they offer one on one sessions as well as group support when and if she wants that as well. She plans to contact them and schedule something soon.  CSW encouraged her to call them to inquire and schedule. Pt also reports her daughter will be having some medical treatments in the next month- she is hopeful to be able to pursue other options at that time.   CSW will check in with pt again in the next 2 weeks.   Eduard Clos, MSW, [place] Worker  [place] [phone]

## 2018-06-29 DIAGNOSIS — H04123 Dry eye syndrome of bilateral lacrimal glands: Secondary | ICD-10-CM | POA: Diagnosis not present

## 2018-07-02 ENCOUNTER — Other Ambulatory Visit: Payer: Self-pay | Admitting: *Deleted

## 2018-07-02 ENCOUNTER — Ambulatory Visit: Payer: Self-pay | Admitting: *Deleted

## 2018-07-02 NOTE — Patient Outreach (Signed)
[facility]) Care Management  07/02/2018  DURINDA BUZZELLI [birth year] [id number]   CSW has been unable to reach pt after 3 or more phone outreach attempts. CSW will close referral due to unsuccessful outreach.  CSW will advise [facility] team and PCP of above.    Eduard Clos, MSW, [place] Worker  [place] [phone]

## 2018-07-08 DIAGNOSIS — J40 Bronchitis, not specified as acute or chronic: Secondary | ICD-10-CM | POA: Diagnosis not present

## 2018-08-08 DIAGNOSIS — E538 Deficiency of other specified B group vitamins: Secondary | ICD-10-CM | POA: Diagnosis not present

## 2018-08-08 DIAGNOSIS — F419 Anxiety disorder, unspecified: Secondary | ICD-10-CM | POA: Diagnosis not present

## 2018-08-08 DIAGNOSIS — J40 Bronchitis, not specified as acute or chronic: Secondary | ICD-10-CM | POA: Diagnosis not present

## 2018-08-11 ENCOUNTER — Other Ambulatory Visit: Payer: Self-pay

## 2018-09-07 DIAGNOSIS — J40 Bronchitis, not specified as acute or chronic: Secondary | ICD-10-CM | POA: Diagnosis not present

## 2018-10-07 DIAGNOSIS — E785 Hyperlipidemia, unspecified: Secondary | ICD-10-CM | POA: Diagnosis not present

## 2018-10-07 DIAGNOSIS — G629 Polyneuropathy, unspecified: Secondary | ICD-10-CM | POA: Diagnosis not present

## 2018-10-07 DIAGNOSIS — R0602 Shortness of breath: Secondary | ICD-10-CM | POA: Diagnosis not present

## 2018-10-07 DIAGNOSIS — F329 Major depressive disorder, single episode, unspecified: Secondary | ICD-10-CM | POA: Diagnosis not present

## 2018-10-07 DIAGNOSIS — J449 Chronic obstructive pulmonary disease, unspecified: Secondary | ICD-10-CM | POA: Diagnosis not present

## 2018-10-07 DIAGNOSIS — Z79899 Other long term (current) drug therapy: Secondary | ICD-10-CM | POA: Diagnosis not present

## 2018-10-07 DIAGNOSIS — R0789 Other chest pain: Secondary | ICD-10-CM | POA: Diagnosis not present

## 2018-10-07 DIAGNOSIS — R079 Chest pain, unspecified: Secondary | ICD-10-CM | POA: Diagnosis not present

## 2018-10-07 DIAGNOSIS — I10 Essential (primary) hypertension: Secondary | ICD-10-CM | POA: Diagnosis not present

## 2018-10-08 DIAGNOSIS — E785 Hyperlipidemia, unspecified: Secondary | ICD-10-CM | POA: Diagnosis not present

## 2018-10-08 DIAGNOSIS — I10 Essential (primary) hypertension: Secondary | ICD-10-CM | POA: Diagnosis not present

## 2018-10-08 DIAGNOSIS — R079 Chest pain, unspecified: Secondary | ICD-10-CM | POA: Diagnosis not present

## 2018-10-08 DIAGNOSIS — F329 Major depressive disorder, single episode, unspecified: Secondary | ICD-10-CM | POA: Diagnosis not present

## 2018-10-08 DIAGNOSIS — J40 Bronchitis, not specified as acute or chronic: Secondary | ICD-10-CM | POA: Diagnosis not present

## 2018-10-13 DIAGNOSIS — L570 Actinic keratosis: Secondary | ICD-10-CM | POA: Diagnosis not present

## 2018-10-13 DIAGNOSIS — D485 Neoplasm of uncertain behavior of skin: Secondary | ICD-10-CM | POA: Diagnosis not present

## 2018-10-14 DIAGNOSIS — I110 Hypertensive heart disease with heart failure: Secondary | ICD-10-CM | POA: Diagnosis not present

## 2018-10-14 DIAGNOSIS — Z6832 Body mass index (BMI) 32.0-32.9, adult: Secondary | ICD-10-CM | POA: Diagnosis not present

## 2018-10-14 DIAGNOSIS — J449 Chronic obstructive pulmonary disease, unspecified: Secondary | ICD-10-CM | POA: Diagnosis not present

## 2018-10-14 DIAGNOSIS — I200 Unstable angina: Secondary | ICD-10-CM | POA: Diagnosis not present

## 2018-10-14 DIAGNOSIS — G2581 Restless legs syndrome: Secondary | ICD-10-CM | POA: Diagnosis not present

## 2018-10-14 DIAGNOSIS — I5030 Unspecified diastolic (congestive) heart failure: Secondary | ICD-10-CM | POA: Diagnosis not present

## 2018-10-14 DIAGNOSIS — E669 Obesity, unspecified: Secondary | ICD-10-CM | POA: Diagnosis not present

## 2018-10-14 DIAGNOSIS — N183 Chronic kidney disease, stage 3 (moderate): Secondary | ICD-10-CM | POA: Diagnosis not present

## 2018-10-15 DIAGNOSIS — I130 Hypertensive heart and chronic kidney disease with heart failure and stage 1 through stage 4 chronic kidney disease, or unspecified chronic kidney disease: Secondary | ICD-10-CM | POA: Diagnosis not present

## 2018-10-15 DIAGNOSIS — N182 Chronic kidney disease, stage 2 (mild): Secondary | ICD-10-CM | POA: Diagnosis not present

## 2018-10-15 DIAGNOSIS — I5032 Chronic diastolic (congestive) heart failure: Secondary | ICD-10-CM | POA: Diagnosis not present

## 2018-10-15 DIAGNOSIS — R600 Localized edema: Secondary | ICD-10-CM | POA: Diagnosis not present

## 2018-10-22 ENCOUNTER — Other Ambulatory Visit: Payer: Self-pay

## 2018-10-22 ENCOUNTER — Encounter: Payer: Self-pay | Admitting: Cardiology

## 2018-10-22 ENCOUNTER — Ambulatory Visit (INDEPENDENT_AMBULATORY_CARE_PROVIDER_SITE_OTHER): Payer: PPO | Admitting: Cardiology

## 2018-10-22 VITALS — BP 118/66 | HR 74 | Ht 60.0 in | Wt 170.0 lb

## 2018-10-22 DIAGNOSIS — I2510 Atherosclerotic heart disease of native coronary artery without angina pectoris: Secondary | ICD-10-CM

## 2018-10-22 DIAGNOSIS — I2583 Coronary atherosclerosis due to lipid rich plaque: Secondary | ICD-10-CM | POA: Insufficient documentation

## 2018-10-22 DIAGNOSIS — Z13220 Encounter for screening for lipoid disorders: Secondary | ICD-10-CM | POA: Diagnosis not present

## 2018-10-22 DIAGNOSIS — I447 Left bundle-branch block, unspecified: Secondary | ICD-10-CM

## 2018-10-22 DIAGNOSIS — I10 Essential (primary) hypertension: Secondary | ICD-10-CM | POA: Diagnosis not present

## 2018-10-22 HISTORY — DX: Coronary atherosclerosis due to lipid rich plaque: I25.83

## 2018-10-22 HISTORY — DX: Atherosclerotic heart disease of native coronary artery without angina pectoris: I25.10

## 2018-10-22 MED ORDER — ASPIRIN EC 81 MG PO TBEC: 81.0000 mg | DELAYED_RELEASE_TABLET | Freq: Every day | ORAL | 3 refills | Status: DC

## 2018-10-22 MED ORDER — ATORVASTATIN CALCIUM 10 MG PO TABS: 10.0000 mg | ORAL_TABLET | Freq: Every day | ORAL | 3 refills | Status: DC

## 2018-10-22 MED ORDER — NITROGLYCERIN 0.4 MG SL SUBL: 0.4000 mg | SUBLINGUAL_TABLET | SUBLINGUAL | 3 refills | Status: DC | PRN

## 2018-10-22 NOTE — Progress Notes (Signed)
Cardiology Office Note:    Date:  10/22/2018   ID:  CHINIQUA KILCREASE, DOB May 28, 1945, MRN [id number]  PCP:  Street, Sharon [place], MD  Cardiologist:  [name], MD   Referring MD: [address], Sharon Mt, *    ASSESSMENT:    1. Coronary artery calcification seen on CT scan   2. Essential hypertension   3. LBBB (left bundle branch block)    PLAN:    In order of problems listed above:  1. Coronary calcification: Secondary prevention stressed with the patient.  Importance of compliance with diet and medication stressed and she vocalized understanding.  I mentioned to her that her lipids are fine.  Her LDL was about 100.  But I would still prefer her to be on statin therapy in view of the above and have initiated her on atorvastatin 10 mg daily.  Her liver lipid checks at [facility] were reviewed by me.  She will be back in 1 month to see me in follow-up appointment.  At that time she will have a follow-up Chem-7 liver lipid check.  We will give her a early morning appointment so she can come fasting. 2. Essential hypertension: Blood pressure stable 3. Overweight: Diet was discussed and the importance of regular exercise stressed.  Extensive discussion was done about hospitalization and reports from [facility] including stress test and CT scan report.  She knows to go to the nearest emergency room for any concerning symptoms.  The patient's symptoms overall appear atypical and since hospital discharge she is not had any issues.  Total time for this evaluation including records was 40 minutes   Medication Adjustments/Labs and Tests Ordered: Current medicines are reviewed at length with the patient today.  Concerns regarding medicines are outlined above.  No orders of the defined types were placed in this encounter.  No orders of the defined types were placed in this encounter.    No chief complaint on file.    History of Present Illness:    Kelsey Rivera is a  73 y.o. female with past medical history of essential hypertension.  Patient went to [facility] with chest pain and was evaluated.  A stress test was done and this did not reveal any convincing evidence of ischemia.  Since hospital discharge patient is ambulatory and doing fine.  No chest pain orthopnea or PND.  At the time of my evaluation, the patient is alert awake oriented and in no distress.    Past Medical History:  Diagnosis Date  . Arthritis of foot, left 01/09/2016  . Baker's cyst of knee 02/06/2017  . Chest pain in adult 04/25/2015   Overview:  Lexiscan  MPS with normal perfusion and function, EF 52%  . Essential hypertension 04/26/2015  . Extensor tendonitis of foot 12/14/2015   Overview:  Left  . Hyperlipidemia 02/06/2017  . Injury of left foot 07/25/2015  . LBBB (left bundle branch block) 04/25/2015  . Migraine 02/06/2017  . Osteoarthritis 02/06/2017  . Osteoarthritis of left foot 11/14/2015  . Pathological fracture of metatarsal bone of left foot with routine healing 08/15/2015  . RLS (restless legs syndrome) 04/26/2015    Past Surgical History:  Procedure Laterality Date  . ABDOMINAL HYSTERECTOMY    . CHOLECYSTECTOMY    . FOOT FRACTURE SURGERY    . TONSILLECTOMY    . VEIN SURGERY      Current Medications: Current Meds  Medication Sig  . amLODipine (NORVASC) 10 MG tablet Take 10 mg by  mouth daily.  . Calcium Carbonate-Vitamin D 600-400 MG-UNIT tablet TAKE 2 (TWO) TABLET BY MOUTH DAILY FOR OSTEOPENIA  . diazepam (VALIUM) 10 MG tablet TAKE 1 TABLET BY MOUTH AT BEDTIME EVERY OTHER NIGHT FOR ANXIETY  . escitalopram (LEXAPRO) 5 MG tablet Take 5 mg by mouth daily.  Marland Kitchen esomeprazole (NEXIUM) 20 MG capsule Take 20 mg by mouth daily.   . fluticasone (FLONASE) 50 MCG/ACT nasal spray Place 1 spray into both nostrils daily as needed.  . gabapentin (NEURONTIN) 300 MG capsule take 1 capsule by mouth at bedtime  . hydrALAZINE (APRESOLINE) 25 MG tablet Take 0.5 tablets (12.5 mg  total) by mouth 3 (three) times daily.  . hydrochlorothiazide (HYDRODIURIL) 25 MG tablet TAKE 1 TABLET BY MOUTH EVERY DAY  . hydrOXYzine (ATARAX/VISTARIL) 25 MG tablet Take 25 mg by mouth daily as needed.  . methocarbamol (ROBAXIN) 500 MG tablet TAKE 2 (TWO) TABLET THREE TIMES DAILY AS NEEDED FOR BACK SPASMS  . rOPINIRole (REQUIP) 4 MG tablet Take 2 tablets by mouth daily.  Marland Kitchen telmisartan (MICARDIS) 80 MG tablet Take 80 mg by mouth daily.  Marland Kitchen zolpidem (AMBIEN) 5 MG tablet Take 5 mg by mouth at bedtime as needed.      Allergies:   Sulfa antibiotics   Social History   Socioeconomic History  . Marital status: Married    Spouse name: Not on file  . Number of children: Not on file  . Years of education: Not on file  . Highest education level: Not on file  Occupational History  . Not on file  Social Needs  . Financial resource strain: Not on file  . Food insecurity    Worry: Not on file    Inability: Not on file  . Transportation needs    Medical: Not on file    Non-medical: Not on file  Tobacco Use  . Smoking status: Never Smoker  . Smokeless tobacco: Never Used  Substance and Sexual Activity  . Alcohol use: No  . Drug use: No  . Sexual activity: Not on file  Lifestyle  . Physical activity    Days per week: Not on file    Minutes per session: Not on file  . Stress: Not on file  Relationships  . Social Herbalist on phone: Not on file    Gets together: Not on file    Attends religious service: Not on file    Active member of club or organization: Not on file    Attends meetings of clubs or organizations: Not on file    Relationship status: Not on file  Other Topics Concern  . Not on file  Social History Narrative  . Not on file     Family History: The patient's family history includes CAD in her brother; Cancer in her brother; Heart attack in her father and mother; Pulmonary embolism in her brother.  ROS:   Please see the history of present illness.     All other systems reviewed and are negative.  EKGs/Labs/Other Studies Reviewed:    The following studies were reviewed today: I reviewed [facility] hospital records extensively including lab work, lipids stress test report and CT scan reports.   Recent Labs: No results found for requested labs within last 8760 hours.  Recent Lipid Panel No results found for: CHOL, TRIG, HDL, CHOLHDL, VLDL, LDLCALC, LDLDIRECT  Physical Exam:    VS:  BP 118/66 (BP Location: Right Arm, Patient Position: Sitting, Cuff Size: Normal)   Pulse  74   Ht 5' (1.524 m)   Wt 170 lb (77.1 kg)   SpO2 93%   BMI 33.20 kg/m     Wt Readings from Last 3 Encounters:  10/22/18 170 lb (77.1 kg)  05/15/18 174 lb (78.9 kg)     GEN: Patient is in no acute distress HEENT: Normal NECK: No JVD; No carotid bruits LYMPHATICS: No lymphadenopathy CARDIAC: Hear sounds regular, 2/6 systolic murmur at the apex. RESPIRATORY:  Clear to auscultation without rales, wheezing or rhonchi  ABDOMEN: Soft, non-tender, non-distended MUSCULOSKELETAL:  No edema; No deformity  SKIN: Warm and dry NEUROLOGIC:  Alert and oriented x 3 PSYCHIATRIC:  Normal affect   Signed, [name], MD  10/22/2018 4:24 PM    [facility]

## 2018-10-22 NOTE — Patient Instructions (Signed)
Medication Instructions:  Your physician has recommended you make the following change in your medication:  START taking aspirin 81 mg (1 tablet) once daily  START taking nitroglycerin as needed for chest pain When having chest pain, stop what you are doing and sit down. Take 1 nitro, wait 5 minutes. Still having chest pain, take 1 nitro, wait 5 minutes. Still having chest pain, take 1 nitro, dial 911. Total of 3 nitro in 15 minutes.  START taking atorvastatin 10 mg (1 tablet) once daily  If you need a refill on your cardiac medications before your next appointment, please call your pharmacy.   Lab work: Your physician recommends that you return FASTING in 30 days for BMP, LFT, lipid panel to be drawn.    If you have labs (blood work) drawn today and your tests are completely normal, you will receive your results only by:  [place] (if you have MyChart) OR  A paper copy in the mail If you have any lab test that is abnormal or we need to change your treatment, we will call you to review the results.  Testing/Procedures: You had an EKG performed today.  Follow-Up: At [facility], you and your health needs are our priority.  As part of our continuing mission to provide you with exceptional heart care, we have created designated Provider Care Teams.  These Care Teams include your primary Cardiologist (physician) and Advanced Practice Providers (APPs -  Physician Assistants and Nurse Practitioners) who all work together to provide you with the care you need, when you need it. You will need a follow up appointment in 1 months.    Any Other Special Instructions Will Be Listed Below  Atorvastatin tablets What is this medicine? ATORVASTATIN (a TORE va sta tin) is known as a HMG-CoA reductase inhibitor or 'statin'. It lowers the level of cholesterol and triglycerides in the blood. This drug may also reduce the risk of heart attack, stroke, or other health problems in patients with  risk factors for heart disease. Diet and lifestyle changes are often used with this drug. This medicine may be used for other purposes; ask your health care provider or pharmacist if you have questions. COMMON BRAND NAME(S): Lipitor What should I tell my health care provider before I take this medicine? They need to know if you have any of these conditions: -diabetes -if you often drink alcohol -history of stroke -kidney disease -liver disease -muscle aches or weakness -thyroid disease -an unusual or allergic reaction to atorvastatin, other medicines, foods, dyes, or preservatives -pregnant or trying to get pregnant -breast-feeding How should I use this medicine? Take this medicine by mouth with a glass of water. Follow the directions on the prescription label. You can take it with or without food. If it upsets your stomach, take it with food. Do not take with grapefruit juice. Take your medicine at regular intervals. Do not take it more often than directed. Do not stop taking except on your doctor's advice. Talk to your pediatrician regarding the use of this medicine in children. While this drug may be prescribed for children as young as 10 for selected conditions, precautions do apply. Overdosage: If you think you have taken too much of this medicine contact a poison control center or emergency room at once. NOTE: This medicine is only for you. Do not share this medicine with others. What if I miss a dose? If you miss a dose, take it as soon as you can. If your next  dose is to be taken in less than 12 hours, then do not take the missed dose. Take the next dose at your regular time. Do not take double or extra doses. What may interact with this medicine? Do not take this medicine with any of the following medications: -dasabuvir; ombitasvir; paritaprevir; ritonavir -ombitasvir; paritaprevir; ritonavir -posaconazole -red yeast rice This medicine may also interact with the following  medications: -alcohol -birth control pills -certain antibiotics like erythromycin and clarithromycin -certain antivirals for HIV or hepatitis -certain medicines for cholesterol like fenofibrate, gemfibrozil, and niacin -certain medicines for fungal infections like ketoconazole and itraconazole -colchicine -cyclosporine -digoxin -grapefruit juice -rifampin This list may not describe all possible interactions. Give your health care provider a list of all the medicines, herbs, non-prescription drugs, or dietary supplements you use. Also tell them if you smoke, drink alcohol, or use illegal drugs. Some items may interact with your medicine. What should I watch for while using this medicine? Visit your doctor or health care professional for regular check-ups. You may need regular tests to make sure your liver is working properly. Your health care professional may tell you to stop taking this medicine if you develop muscle problems. If your muscle problems do not go away after stopping this medicine, contact your health care professional. Do not become pregnant while taking this medicine. Women should inform their health care professional if they wish to become pregnant or think they might be pregnant. There is a potential for serious side effects to an unborn child. Talk to your health care professional or pharmacist for more information. Do not breast-feed an infant while taking this medicine. This medicine may affect blood sugar levels. If you have diabetes, check with your doctor or health care professional before you change your diet or the dose of your diabetic medicine. If you are going to need surgery or other procedure, tell your doctor that you are using this medicine. This drug is only part of a total heart-health program. Your doctor or a dietician can suggest a low-cholesterol and low-fat diet to help. Avoid alcohol and smoking, and keep a proper exercise schedule. This medicine may cause  a decrease in Co-Enzyme Q-10. You should make sure that you get enough Co-Enzyme Q-10 while you are taking this medicine. Discuss the foods you eat and the vitamins you take with your health care professional. What side effects may I notice from receiving this medicine? Side effects that you should report to your doctor or health care professional as soon as possible: -allergic reactions like skin rash, itching or hives, swelling of the face, lips, or tongue -fever -joint pain -loss of memory -redness, blistering, peeling or loosening of the skin, including inside the mouth -signs and symptoms of liver injury like dark yellow or brown urine; general ill feeling or flu-like symptoms; light-belly pain; unusually weak or tired; yellowing of the eyes or skin -signs and symptoms of muscle injury like dark urine; trouble passing urine or change in the amount of urine; unusually weak or tired; muscle pain or side or back pain Side effects that usually do not require medical attention (report to your doctor or health care professional if they continue or are bothersome): -diarrhea -nausea -stomach pain -trouble sleeping -upset stomach This list may not describe all possible side effects. Call your doctor for medical advice about side effects. You may report side effects to FDA at 1-800-FDA-1088. Where should I keep my medicine? Keep out of the reach of children. Store between 20  and 25 degrees C (68 and 77 degrees F). Throw away any unused medicine after the expiration date. NOTE: This sheet is a summary. It may not cover all possible information. If you have questions about this medicine, talk to your doctor, pharmacist, or health care provider.  2019 Elsevier/Gold Standard (2017-08-15 11:36:48)    Aspirin and Your Heart  Aspirin is a medicine that prevents the cells in the blood that are used for clotting, called platelets, from sticking together. Aspirin can be used to help reduce the risk of  blood clots, heart attacks, and other heart-related problems. Can I take aspirin? Your health care provider will help you determine whether it is safe and beneficial for you to take aspirin daily. Taking aspirin daily may be helpful if you:  Have had a heart attack or chest pain.  Are at risk for a heart attack.  Have undergone open-heart surgery, such as coronary artery bypass surgery (CABG).  Have had coronary angioplasty or a stent.  Have had certain types of stroke or transient ischemic attack (TIA).  Have peripheral artery disease (PAD).  Have chronic heart rhythm problems such as atrial fibrillation and cannot take an anticoagulant.  Have valve disease or have had surgery on a valve. What are the risks? Daily use of aspirin can cause side effects. Some of these include:  Bleeding. Bleeding problems can be minor or serious. An example of a minor problem is a cut that does not stop bleeding. An example of a more serious problem is stomach bleeding or, rarely, bleeding into the brain. Your risk of bleeding is increased if you are also taking non-steroidal anti-inflammatory drugs (NSAIDs).  Increased bruising.  Upset stomach.  An allergic reaction. People who have nasal polyps have an increased risk of developing an aspirin allergy. General guidelines  Take aspirin only as told by your health care provider. Make sure that you understand how much you should take and what form you should take. The two forms of aspirin are: ? Non-enteric-coated.This type of aspirin does not have a coating and is absorbed quickly. This type of aspirin also comes in a chewable form. ? Enteric-coated. This type of aspirin has a coating that releases the medicine very slowly. Enteric-coated aspirin might cause less stomach upset than non-enteric-coated aspirin. This type of aspirin should not be chewed or crushed.  Limit alcohol intake to no more than 1 drink a day for nonpregnant women and 2 drinks a  day for men. Drinking alcohol increases your risk of bleeding. One drink equals 12 oz of beer, 5 oz of wine, or 1 oz of hard liquor. Contact a health care provider if you:  Have unusual bleeding or bruising.  Have stomach pain or nausea.  Have ringing in your ears.  Have an allergic reaction that causes: ? Hives. ? Itchy skin. ? Swelling of the lips, tongue, or face. Get help right away if you:  Notice that your bowel movements are bloody, dark red, or black in color.  Vomit or cough up blood.  Have blood in your urine.  Cough, have noisy breathing (wheeze), or feel short of breath.  Have chest pain, especially if the pain spreads to the arms, back, neck, or jaw.  Have a severe headache, or a headache with confusion, or dizziness. These symptoms may represent a serious problem that is an emergency. Do not wait to see if the symptoms will go away. Get medical help right away. Call your local emergency services (911 in the  [place] Do not drive yourself to the hospital. Summary  Aspirin can be used to help reduce the risk of blood clots, heart attacks, and other heart-related problems.  Daily use of aspirin can increase your risk of side effects. Your health care provider will help you determine whether it is safe and beneficial for you to take aspirin daily.  Take aspirin only as told by your health care provider. Make sure that you understand how much you can take and what form you can take. This information is not intended to replace advice given to you by your health care provider. Make sure you discuss any questions you have with your health care provider. Document Released: 03/28/2008 Document Revised: 02/13/2017 Document Reviewed: 02/13/2017 Elsevier Interactive Patient Education  2019 Elsevier Inc.  Nitroglycerin sublingual tablets What is this medicine? NITROGLYCERIN (nye troe GLI ser in) is a type of vasodilator. It relaxes blood vessels, increasing the blood and  oxygen supply to your heart. This medicine is used to relieve chest pain caused by angina. It is also used to prevent chest pain before activities like climbing stairs, going outdoors in cold weather, or sexual activity. This medicine may be used for other purposes; ask your health care provider or pharmacist if you have questions. COMMON BRAND NAME(S): Nitroquick, Nitrostat, Nitrotab What should I tell my health care provider before I take this medicine? They need to know if you have any of these conditions: -anemia -head injury, recent stroke, or bleeding in the brain -liver disease -previous heart attack -an unusual or allergic reaction to nitroglycerin, other medicines, foods, dyes, or preservatives -pregnant or trying to get pregnant -breast-feeding How should I use this medicine? Take this medicine by mouth as needed. At the first sign of an angina attack (chest pain or tightness) place one tablet under your tongue. You can also take this medicine 5 to 10 minutes before an event likely to produce chest pain. Follow the directions on the prescription label. Let the tablet dissolve under the tongue. Do not swallow whole. Replace the dose if you accidentally swallow it. It will help if your mouth is not dry. Saliva around the tablet will help it to dissolve more quickly. Do not eat or drink, smoke or chew tobacco while a tablet is dissolving. If you are not better within 5 minutes after taking ONE dose of nitroglycerin, call 9-1-1 immediately to seek emergency medical care. Do not take more than 3 nitroglycerin tablets over 15 minutes. If you take this medicine often to relieve symptoms of angina, your doctor or health care professional may provide you with different instructions to manage your symptoms. If symptoms do not go away after following these instructions, it is important to call 9-1-1 immediately. Do not take more than 3 nitroglycerin tablets over 15 minutes. Talk to your pediatrician  regarding the use of this medicine in children. Special care may be needed. Overdosage: If you think you have taken too much of this medicine contact a poison control center or emergency room at once. NOTE: This medicine is only for you. Do not share this medicine with others. What if I miss a dose? This does not apply. This medicine is only used as needed. What may interact with this medicine? Do not take this medicine with any of the following medications: -certain migraine medicines like ergotamine and dihydroergotamine (DHE) -medicines used to treat erectile dysfunction like sildenafil, tadalafil, and vardenafil -riociguat This medicine may also interact with the following medications: -alteplase -aspirin -heparin -medicines  for high blood pressure -medicines for mental depression -other medicines used to treat angina -phenothiazines like chlorpromazine, mesoridazine, prochlorperazine, thioridazine This list may not describe all possible interactions. Give your health care provider a list of all the medicines, herbs, non-prescription drugs, or dietary supplements you use. Also tell them if you smoke, drink alcohol, or use illegal drugs. Some items may interact with your medicine. What should I watch for while using this medicine? Tell your doctor or health care professional if you feel your medicine is no longer working. Keep this medicine with you at all times. Sit or lie down when you take your medicine to prevent falling if you feel dizzy or faint after using it. Try to remain calm. This will help you to feel better faster. If you feel dizzy, take several deep breaths and lie down with your feet propped up, or bend forward with your head resting between your knees. You may get drowsy or dizzy. Do not drive, use machinery, or do anything that needs mental alertness until you know how this drug affects you. Do not stand or sit up quickly, especially if you are an older patient. This  reduces the risk of dizzy or fainting spells. Alcohol can make you more drowsy and dizzy. Avoid alcoholic drinks. Do not treat yourself for coughs, colds, or pain while you are taking this medicine without asking your doctor or health care professional for advice. Some ingredients may increase your blood pressure. What side effects may I notice from receiving this medicine? Side effects that you should report to your doctor or health care professional as soon as possible: -blurred vision -dry mouth -skin rash -sweating -the feeling of extreme pressure in the head -unusually weak or tired Side effects that usually do not require medical attention (report to your doctor or health care professional if they continue or are bothersome): -flushing of the face or neck -headache -irregular heartbeat, palpitations -nausea, vomiting This list may not describe all possible side effects. Call your doctor for medical advice about side effects. You may report side effects to FDA at 1-800-FDA-1088. Where should I keep my medicine? Keep out of the reach of children. Store at room temperature between 20 and 25 degrees C (68 and 77 degrees F). Store in [facility] [employer]. Protect from light and moisture. Keep tightly closed. Throw away any unused medicine after the expiration date. NOTE: This sheet is a summary. It may not cover all possible information. If you have questions about this medicine, talk to your doctor, pharmacist, or health care provider.  2019 Elsevier/Gold Standard (2013-02-11 17:57:36)

## 2018-11-04 ENCOUNTER — Other Ambulatory Visit: Payer: Self-pay | Admitting: Cardiology

## 2018-11-07 DIAGNOSIS — J40 Bronchitis, not specified as acute or chronic: Secondary | ICD-10-CM | POA: Diagnosis not present

## 2018-11-13 DIAGNOSIS — M1990 Unspecified osteoarthritis, unspecified site: Secondary | ICD-10-CM | POA: Diagnosis not present

## 2018-11-13 DIAGNOSIS — Z6832 Body mass index (BMI) 32.0-32.9, adult: Secondary | ICD-10-CM | POA: Diagnosis not present

## 2018-11-18 ENCOUNTER — Ambulatory Visit: Payer: PPO | Admitting: Cardiology

## 2018-11-19 ENCOUNTER — Other Ambulatory Visit: Payer: Self-pay

## 2018-11-19 ENCOUNTER — Encounter: Payer: Self-pay | Admitting: Cardiology

## 2018-11-19 ENCOUNTER — Ambulatory Visit (INDEPENDENT_AMBULATORY_CARE_PROVIDER_SITE_OTHER): Payer: PPO | Admitting: Cardiology

## 2018-11-19 VITALS — BP 120/62 | HR 62 | Ht 60.0 in | Wt 168.0 lb

## 2018-11-19 DIAGNOSIS — I2510 Atherosclerotic heart disease of native coronary artery without angina pectoris: Secondary | ICD-10-CM

## 2018-11-19 DIAGNOSIS — I10 Essential (primary) hypertension: Secondary | ICD-10-CM | POA: Diagnosis not present

## 2018-11-19 DIAGNOSIS — Z1329 Encounter for screening for other suspected endocrine disorder: Secondary | ICD-10-CM | POA: Diagnosis not present

## 2018-11-19 DIAGNOSIS — E782 Mixed hyperlipidemia: Secondary | ICD-10-CM | POA: Diagnosis not present

## 2018-11-19 NOTE — Progress Notes (Signed)
Cardiology Office Note:    Date:  11/19/2018   ID:  Kelsey Rivera, DOB [birth year], MRN [id number]  PCP:  Street, Sharon [place], MD  Cardiologist:  [name], MD   Referring MD: [address], [place], *    ASSESSMENT:    1. Coronary artery calcification seen on CT scan   2. Essential hypertension   3. Mixed hyperlipidemia    PLAN:    In order of problems listed above:  1. Coronary artery disease: Secondary prevention stressed with the patient.  Importance of compliance with diet and medication stressed and she vocalized understanding. 2. Essential hypertension: Blood pressure stable 3. Mixed dyslipidemia: She will come back in the morning for blood work including fasting lipids.  Weight reduction was stressed.  Importance of compliance with exercise stressed and she vocalized understanding. 4. Patient will be seen in follow-up appointment in 6 months or earlier if the patient has any concerns    Medication Adjustments/Labs and Tests Ordered: Current medicines are reviewed at length with the patient today.  Concerns regarding medicines are outlined above.  No orders of the defined types were placed in this encounter.  No orders of the defined types were placed in this encounter.    No chief complaint on file.    History of Present Illness:    Kelsey Rivera is a 73 y.o. female.  Patient has history of coronary calcifications on CT scan, essential hypertension and dyslipidemia.  She denies any problems at this time.  She leads a sedentary lifestyle.  She is overweight.  No chest pain orthopnea or PND.  At the time of my evaluation, the patient is alert awake oriented and in no distress.  Past Medical History:  Diagnosis Date  . Arthritis of foot, left 01/09/2016  . Baker's cyst of knee 02/06/2017  . Chest pain in adult 04/25/2015   Overview:  Lexiscan  MPS with normal perfusion and function, EF 52%  . Essential hypertension 04/26/2015  . Extensor tendonitis  of foot 12/14/2015   Overview:  Left  . Hyperlipidemia 02/06/2017  . Injury of left foot 07/25/2015  . LBBB (left bundle branch block) 04/25/2015  . Migraine 02/06/2017  . Osteoarthritis 02/06/2017  . Osteoarthritis of left foot 11/14/2015  . Pathological fracture of metatarsal bone of left foot with routine healing 08/15/2015  . RLS (restless legs syndrome) 04/26/2015    Past Surgical History:  Procedure Laterality Date  . ABDOMINAL HYSTERECTOMY    . CHOLECYSTECTOMY    . FOOT FRACTURE SURGERY    . TONSILLECTOMY    . VEIN SURGERY      Current Medications: Current Meds  Medication Sig  . amLODipine (NORVASC) 10 MG tablet Take 10 mg by mouth daily.  Marland Kitchen aspirin EC 81 MG tablet Take 1 tablet (81 mg total) by mouth daily.  Marland Kitchen atorvastatin (LIPITOR) 10 MG tablet Take 1 tablet (10 mg total) by mouth daily.  . Calcium Carbonate-Vitamin D 600-400 MG-UNIT tablet TAKE 2 (TWO) TABLET BY MOUTH DAILY FOR OSTEOPENIA  . diazepam (VALIUM) 10 MG tablet TAKE 1 TABLET BY MOUTH AT BEDTIME EVERY OTHER NIGHT FOR ANXIETY  . escitalopram (LEXAPRO) 5 MG tablet Take 5 mg by mouth daily.  Marland Kitchen esomeprazole (NEXIUM) 20 MG capsule Take 20 mg by mouth daily.   . fluticasone (FLONASE) 50 MCG/ACT nasal spray Place 1 spray into both nostrils daily as needed.  . gabapentin (NEURONTIN) 300 MG capsule take 1 capsule by mouth at bedtime  . hydrALAZINE (APRESOLINE) 25  MG tablet TAKE 0.5 TABLETS (12.5 MG TOTAL) BY MOUTH 3 (THREE) TIMES DAILY.  . hydrochlorothiazide (HYDRODIURIL) 25 MG tablet TAKE 1 TABLET BY MOUTH EVERY DAY  . hydrOXYzine (ATARAX/VISTARIL) 25 MG tablet Take 25 mg by mouth daily as needed.  . methocarbamol (ROBAXIN) 500 MG tablet TAKE 2 (TWO) TABLET THREE TIMES DAILY AS NEEDED FOR BACK SPASMS  . nitroGLYCERIN (NITROSTAT) 0.4 MG SL tablet Place 1 tablet (0.4 mg total) under the tongue every 5 (five) minutes as needed.  . predniSONE (STERAPRED UNI-PAK 21 TAB) 10 MG (21) TBPK tablet See admin instructions. see  package  . rOPINIRole (REQUIP) 4 MG tablet Take 2 tablets by mouth daily.  Marland Kitchen telmisartan (MICARDIS) 80 MG tablet Take 80 mg by mouth daily.  Marland Kitchen zolpidem (AMBIEN) 5 MG tablet Take 5 mg by mouth at bedtime as needed.      Allergies:   Sulfa antibiotics   Social History   Socioeconomic History  . Marital status: Married    Spouse name: Not on file  . Number of children: Not on file  . Years of education: Not on file  . Highest education level: Not on file  Occupational History  . Not on file  Social Needs  . Financial resource strain: Not on file  . Food insecurity    Worry: Not on file    Inability: Not on file  . Transportation needs    Medical: Not on file    Non-medical: Not on file  Tobacco Use  . Smoking status: Never Smoker  . Smokeless tobacco: Never Used  Substance and Sexual Activity  . Alcohol use: No  . Drug use: No  . Sexual activity: Not on file  Lifestyle  . Physical activity    Days per week: Not on file    Minutes per session: Not on file  . Stress: Not on file  Relationships  . Social Herbalist on phone: Not on file    Gets together: Not on file    Attends religious service: Not on file    Active member of club or organization: Not on file    Attends meetings of clubs or organizations: Not on file    Relationship status: Not on file  Other Topics Concern  . Not on file  Social History Narrative  . Not on file     Family History: The patient's family history includes CAD in her brother; Cancer in her brother; Heart attack in her father and mother; Pulmonary embolism in her brother.  ROS:   Please see the history of present illness.    All other systems reviewed and are negative.  EKGs/Labs/Other Studies Reviewed:    The following studies were reviewed today: CT scan reports were again discussed as follows: Coronary calcification is concerned   Recent Labs: No results found for requested labs within last 8760 hours.  Recent  Lipid Panel No results found for: CHOL, TRIG, HDL, CHOLHDL, VLDL, LDLCALC, LDLDIRECT  Physical Exam:    VS:  Ht 5' (1.524 m)   Wt 168 lb (76.2 kg)   BMI 32.81 kg/m     Wt Readings from Last 3 Encounters:  11/19/18 168 lb (76.2 kg)  10/22/18 170 lb (77.1 kg)  05/15/18 174 lb (78.9 kg)     GEN: Patient is in no acute distress HEENT: Normal NECK: No JVD; No carotid bruits LYMPHATICS: No lymphadenopathy CARDIAC: Hear sounds regular, 2/6 systolic murmur at the apex. RESPIRATORY:  Clear to auscultation  without rales, wheezing or rhonchi  ABDOMEN: Soft, non-tender, non-distended MUSCULOSKELETAL:  No edema; No deformity  SKIN: Warm and dry NEUROLOGIC:  Alert and oriented x 3 PSYCHIATRIC:  Normal affect   Signed, [name], MD  11/19/2018 3:24 PM    [facility]

## 2018-11-19 NOTE — Patient Instructions (Signed)
Medication Instructions:  Your physician recommends that you continue on your current medications as directed. Please refer to the Current Medication list given to you today.  If you need a refill on your cardiac medications before your next appointment, please call your pharmacy.   Lab work: Your physician recommends that you return FASTING tomorrow for VMP, TSH, hepatic, and lipid panel   If you have labs (blood work) drawn today and your tests are completely normal, you will receive your results only by: Marland Kitchen MyChart Message (if you have MyChart) OR . A paper copy in the mail If you have any lab test that is abnormal or we need to change your treatment, we will call you to review the results.  Testing/Procedures: NONE  Follow-Up: At [facility], you and your health needs are our priority.  As part of our continuing mission to provide you with exceptional heart care, we have created designated Provider Care Teams.  These Care Teams include your primary Cardiologist (physician) and Advanced Practice Providers (APPs -  Physician Assistants and Nurse Practitioners) who all work together to provide you with the care you need, when you need it. You will need a follow up appointment in 6 months.

## 2018-11-19 NOTE — Addendum Note (Signed)
Addended by: Beckey Rutter on: 11/19/2018 03:42 PM   Modules accepted: Orders

## 2018-11-20 DIAGNOSIS — E782 Mixed hyperlipidemia: Secondary | ICD-10-CM | POA: Diagnosis not present

## 2018-11-20 DIAGNOSIS — I10 Essential (primary) hypertension: Secondary | ICD-10-CM | POA: Diagnosis not present

## 2018-11-20 DIAGNOSIS — I2510 Atherosclerotic heart disease of native coronary artery without angina pectoris: Secondary | ICD-10-CM | POA: Diagnosis not present

## 2018-11-20 DIAGNOSIS — Z1329 Encounter for screening for other suspected endocrine disorder: Secondary | ICD-10-CM | POA: Diagnosis not present

## 2018-11-21 LAB — BASIC METABOLIC PANEL
BUN/Creatinine Ratio: 25 (ref 12–28)
BUN: 19 mg/dL (ref 8–27)
CO2: 24 mmol/L (ref 20–29)
Calcium: 9.5 mg/dL (ref 8.7–10.3)
Chloride: 102 mmol/L (ref 96–106)
Creatinine, Ser: 0.77 mg/dL (ref 0.57–1.00)
GFR calc Af Amer: 89 mL/min/{1.73_m2} (ref >59)
GFR calc non Af Amer: 77 mL/min/{1.73_m2} (ref >59)
Glucose: 114 mg/dL — ABNORMAL HIGH (ref 65–99)
Potassium: 4.3 mmol/L (ref 3.5–5.2)
Sodium: 140 mmol/L (ref 134–144)

## 2018-11-21 LAB — HEPATIC FUNCTION PANEL
ALT: 11 IU/L (ref 0–32)
AST: 13 IU/L (ref 0–40)
Albumin: 4.1 g/dL (ref 3.7–4.7)
Alkaline Phosphatase: 65 IU/L (ref 39–117)
Bilirubin Total: 0.5 mg/dL (ref 0.0–1.2)
Bilirubin, Direct: 0.12 mg/dL (ref 0.00–0.40)
Total Protein: 6.7 g/dL (ref 6.0–8.5)

## 2018-11-21 LAB — LIPID PANEL
Chol/HDL Ratio: 3 ratio (ref 0.0–4.4)
Cholesterol, Total: 199 mg/dL (ref 100–199)
HDL: 66 mg/dL (ref >39)
LDL Calculated: 119 mg/dL — ABNORMAL HIGH (ref 0–99)
Triglycerides: 71 mg/dL (ref 0–149)
VLDL Cholesterol Cal: 14 mg/dL (ref 5–40)

## 2018-11-21 LAB — TSH: TSH: 1.36 u[IU]/mL (ref 0.450–4.500)

## 2018-11-24 DIAGNOSIS — S96911A Strain of unspecified muscle and tendon at ankle and foot level, right foot, initial encounter: Secondary | ICD-10-CM | POA: Diagnosis not present

## 2018-11-25 ENCOUNTER — Telehealth: Payer: Self-pay

## 2018-11-25 DIAGNOSIS — I10 Essential (primary) hypertension: Secondary | ICD-10-CM

## 2018-11-25 DIAGNOSIS — E782 Mixed hyperlipidemia: Secondary | ICD-10-CM

## 2018-11-25 NOTE — Addendum Note (Signed)
Addended by: Beckey Rutter on: 11/25/2018 09:30 AM   Modules accepted: Orders

## 2018-11-25 NOTE — Telephone Encounter (Signed)
Information relayed, patient state she will try to improve diet. She will come back in mid Sept for repeat labs. Copy of results sent to Dr.Street per Dr. [name] request.

## 2018-11-26 DIAGNOSIS — M19071 Primary osteoarthritis, right ankle and foot: Secondary | ICD-10-CM | POA: Diagnosis not present

## 2018-12-08 DIAGNOSIS — J40 Bronchitis, not specified as acute or chronic: Secondary | ICD-10-CM | POA: Diagnosis not present

## 2018-12-09 DIAGNOSIS — I5032 Chronic diastolic (congestive) heart failure: Secondary | ICD-10-CM | POA: Diagnosis not present

## 2018-12-09 DIAGNOSIS — I872 Venous insufficiency (chronic) (peripheral): Secondary | ICD-10-CM | POA: Diagnosis not present

## 2018-12-09 DIAGNOSIS — R600 Localized edema: Secondary | ICD-10-CM | POA: Diagnosis not present

## 2018-12-11 DIAGNOSIS — R600 Localized edema: Secondary | ICD-10-CM | POA: Diagnosis not present

## 2018-12-11 DIAGNOSIS — M7989 Other specified soft tissue disorders: Secondary | ICD-10-CM | POA: Diagnosis not present

## 2018-12-24 DIAGNOSIS — S63602A Unspecified sprain of left thumb, initial encounter: Secondary | ICD-10-CM | POA: Diagnosis not present

## 2018-12-24 DIAGNOSIS — S60222A Contusion of left hand, initial encounter: Secondary | ICD-10-CM | POA: Diagnosis not present

## 2018-12-28 DIAGNOSIS — E782 Mixed hyperlipidemia: Secondary | ICD-10-CM | POA: Diagnosis not present

## 2018-12-28 DIAGNOSIS — I10 Essential (primary) hypertension: Secondary | ICD-10-CM | POA: Diagnosis not present

## 2018-12-28 LAB — HEPATIC FUNCTION PANEL
ALT: 8 IU/L (ref 0–32)
AST: 16 IU/L (ref 0–40)
Albumin: 4.3 g/dL (ref 3.7–4.7)
Alkaline Phosphatase: 69 IU/L (ref 39–117)
Bilirubin Total: 0.4 mg/dL (ref 0.0–1.2)
Bilirubin, Direct: 0.12 mg/dL (ref 0.00–0.40)
Total Protein: 7 g/dL (ref 6.0–8.5)

## 2018-12-28 LAB — LIPID PANEL
Chol/HDL Ratio: 3.1 ratio (ref 0.0–4.4)
Cholesterol, Total: 191 mg/dL (ref 100–199)
HDL: 61 mg/dL (ref >39)
LDL Chol Calc (NIH): 116 mg/dL — ABNORMAL HIGH (ref 0–99)
Triglycerides: 74 mg/dL (ref 0–149)
VLDL Cholesterol Cal: 14 mg/dL (ref 5–40)

## 2018-12-30 ENCOUNTER — Telehealth: Payer: Self-pay | Admitting: *Deleted

## 2018-12-30 DIAGNOSIS — I447 Left bundle-branch block, unspecified: Secondary | ICD-10-CM

## 2018-12-30 DIAGNOSIS — E782 Mixed hyperlipidemia: Secondary | ICD-10-CM

## 2018-12-30 MED ORDER — ATORVASTATIN CALCIUM 20 MG PO TABS: 20.0000 mg | ORAL_TABLET | Freq: Every day | ORAL | 1 refills | Status: DC

## 2018-12-30 NOTE — Telephone Encounter (Signed)
Telephone call to patient. Informed of labs and need to increase liptor to 20 mg and recheck lipid and liver tests in 6 weeks. Pt verbalized understanding.

## 2018-12-30 NOTE — Telephone Encounter (Signed)
-----   Message from [name], MD sent at 12/29/2018  8:02 AM EDT ----- Double statin, diet and liver lipid check in 6 weeks. [name], MD 12/29/2018 8:02 AM

## 2019-01-08 DIAGNOSIS — J40 Bronchitis, not specified as acute or chronic: Secondary | ICD-10-CM | POA: Diagnosis not present

## 2019-02-02 DIAGNOSIS — M5410 Radiculopathy, site unspecified: Secondary | ICD-10-CM | POA: Diagnosis not present

## 2019-02-02 DIAGNOSIS — R600 Localized edema: Secondary | ICD-10-CM | POA: Diagnosis not present

## 2019-02-02 DIAGNOSIS — Z6832 Body mass index (BMI) 32.0-32.9, adult: Secondary | ICD-10-CM | POA: Diagnosis not present

## 2019-02-02 DIAGNOSIS — M19042 Primary osteoarthritis, left hand: Secondary | ICD-10-CM | POA: Diagnosis not present

## 2019-02-02 DIAGNOSIS — R531 Weakness: Secondary | ICD-10-CM | POA: Diagnosis not present

## 2019-02-02 DIAGNOSIS — N183 Chronic kidney disease, stage 3 unspecified: Secondary | ICD-10-CM | POA: Diagnosis not present

## 2019-02-02 DIAGNOSIS — M19041 Primary osteoarthritis, right hand: Secondary | ICD-10-CM | POA: Diagnosis not present

## 2019-02-02 DIAGNOSIS — M19049 Primary osteoarthritis, unspecified hand: Secondary | ICD-10-CM | POA: Diagnosis not present

## 2019-02-02 DIAGNOSIS — M62830 Muscle spasm of back: Secondary | ICD-10-CM | POA: Diagnosis not present

## 2019-02-03 DIAGNOSIS — M19071 Primary osteoarthritis, right ankle and foot: Secondary | ICD-10-CM | POA: Diagnosis not present

## 2019-02-03 DIAGNOSIS — M19272 Secondary osteoarthritis, left ankle and foot: Secondary | ICD-10-CM | POA: Diagnosis not present

## 2019-02-07 DIAGNOSIS — J40 Bronchitis, not specified as acute or chronic: Secondary | ICD-10-CM | POA: Diagnosis not present

## 2019-02-10 DIAGNOSIS — E782 Mixed hyperlipidemia: Secondary | ICD-10-CM | POA: Diagnosis not present

## 2019-02-10 LAB — HEPATIC FUNCTION PANEL
ALT: 9 IU/L (ref 0–32)
AST: 15 IU/L (ref 0–40)
Albumin: 4.5 g/dL (ref 3.7–4.7)
Alkaline Phosphatase: 75 IU/L (ref 39–117)
Bilirubin Total: 0.4 mg/dL (ref 0.0–1.2)
Bilirubin, Direct: 0.09 mg/dL (ref 0.00–0.40)
Total Protein: 7.2 g/dL (ref 6.0–8.5)

## 2019-02-10 LAB — LIPID PANEL
Chol/HDL Ratio: 2.1 ratio (ref 0.0–4.4)
Cholesterol, Total: 144 mg/dL (ref 100–199)
HDL: 70 mg/dL (ref >39)
LDL Chol Calc (NIH): 62 mg/dL (ref 0–99)
Triglycerides: 54 mg/dL (ref 0–149)
VLDL Cholesterol Cal: 12 mg/dL (ref 5–40)

## 2019-02-15 ENCOUNTER — Telehealth: Payer: Self-pay

## 2019-02-15 NOTE — Telephone Encounter (Signed)
Results relayed, copy sent to Dr. Venetia Maxon.

## 2019-02-15 NOTE — Telephone Encounter (Signed)
-----   Message from [name], MD sent at 02/11/2019 11:49 AM EDT ----- The results of the study is unremarkable. Please inform patient. I will discuss in detail at next appointment. Cc  primary care/referring physician [name], MD 02/11/2019 11:49 AM

## 2019-02-19 ENCOUNTER — Other Ambulatory Visit: Payer: Self-pay | Admitting: Cardiology

## 2019-03-05 DIAGNOSIS — Z6832 Body mass index (BMI) 32.0-32.9, adult: Secondary | ICD-10-CM | POA: Diagnosis not present

## 2019-03-05 DIAGNOSIS — S0003XA Contusion of scalp, initial encounter: Secondary | ICD-10-CM | POA: Diagnosis not present

## 2019-03-05 DIAGNOSIS — S060X9A Concussion with loss of consciousness of unspecified duration, initial encounter: Secondary | ICD-10-CM | POA: Diagnosis not present

## 2019-03-05 DIAGNOSIS — M542 Cervicalgia: Secondary | ICD-10-CM | POA: Diagnosis not present

## 2019-03-10 DIAGNOSIS — J40 Bronchitis, not specified as acute or chronic: Secondary | ICD-10-CM | POA: Diagnosis not present

## 2019-03-12 DIAGNOSIS — G2581 Restless legs syndrome: Secondary | ICD-10-CM | POA: Diagnosis not present

## 2019-03-12 DIAGNOSIS — Z23 Encounter for immunization: Secondary | ICD-10-CM | POA: Diagnosis not present

## 2019-03-12 DIAGNOSIS — M62830 Muscle spasm of back: Secondary | ICD-10-CM | POA: Diagnosis not present

## 2019-03-12 DIAGNOSIS — E669 Obesity, unspecified: Secondary | ICD-10-CM | POA: Diagnosis not present

## 2019-03-12 DIAGNOSIS — M542 Cervicalgia: Secondary | ICD-10-CM | POA: Diagnosis not present

## 2019-03-12 DIAGNOSIS — G4700 Insomnia, unspecified: Secondary | ICD-10-CM | POA: Diagnosis not present

## 2019-03-12 DIAGNOSIS — Z6833 Body mass index (BMI) 33.0-33.9, adult: Secondary | ICD-10-CM | POA: Diagnosis not present

## 2019-03-24 ENCOUNTER — Other Ambulatory Visit: Payer: Self-pay

## 2019-04-09 DIAGNOSIS — J40 Bronchitis, not specified as acute or chronic: Secondary | ICD-10-CM | POA: Diagnosis not present

## 2019-04-14 DIAGNOSIS — E785 Hyperlipidemia, unspecified: Secondary | ICD-10-CM | POA: Diagnosis not present

## 2019-04-14 DIAGNOSIS — M1990 Unspecified osteoarthritis, unspecified site: Secondary | ICD-10-CM | POA: Diagnosis not present

## 2019-04-14 DIAGNOSIS — I5030 Unspecified diastolic (congestive) heart failure: Secondary | ICD-10-CM | POA: Diagnosis not present

## 2019-04-14 DIAGNOSIS — I739 Peripheral vascular disease, unspecified: Secondary | ICD-10-CM | POA: Diagnosis not present

## 2019-04-14 DIAGNOSIS — G2581 Restless legs syndrome: Secondary | ICD-10-CM | POA: Diagnosis not present

## 2019-04-14 DIAGNOSIS — I110 Hypertensive heart disease with heart failure: Secondary | ICD-10-CM | POA: Diagnosis not present

## 2019-04-14 DIAGNOSIS — I5032 Chronic diastolic (congestive) heart failure: Secondary | ICD-10-CM | POA: Diagnosis not present

## 2019-04-14 DIAGNOSIS — N183 Chronic kidney disease, stage 3 unspecified: Secondary | ICD-10-CM | POA: Diagnosis not present

## 2019-04-14 DIAGNOSIS — K219 Gastro-esophageal reflux disease without esophagitis: Secondary | ICD-10-CM | POA: Diagnosis not present

## 2019-04-14 DIAGNOSIS — J449 Chronic obstructive pulmonary disease, unspecified: Secondary | ICD-10-CM | POA: Diagnosis not present

## 2019-04-14 DIAGNOSIS — Z0000 Encounter for general adult medical examination without abnormal findings: Secondary | ICD-10-CM | POA: Diagnosis not present

## 2019-04-14 DIAGNOSIS — G4700 Insomnia, unspecified: Secondary | ICD-10-CM | POA: Diagnosis not present

## 2019-04-15 DIAGNOSIS — L820 Inflamed seborrheic keratosis: Secondary | ICD-10-CM | POA: Diagnosis not present

## 2019-04-15 DIAGNOSIS — L300 Nummular dermatitis: Secondary | ICD-10-CM | POA: Diagnosis not present

## 2019-04-15 DIAGNOSIS — L299 Pruritus, unspecified: Secondary | ICD-10-CM | POA: Diagnosis not present

## 2019-04-15 DIAGNOSIS — L570 Actinic keratosis: Secondary | ICD-10-CM | POA: Diagnosis not present

## 2019-04-15 DIAGNOSIS — D485 Neoplasm of uncertain behavior of skin: Secondary | ICD-10-CM | POA: Diagnosis not present

## 2019-04-15 DIAGNOSIS — L821 Other seborrheic keratosis: Secondary | ICD-10-CM | POA: Diagnosis not present

## 2019-04-22 DIAGNOSIS — I517 Cardiomegaly: Secondary | ICD-10-CM | POA: Diagnosis not present

## 2019-04-22 DIAGNOSIS — W1789XA Other fall from one level to another, initial encounter: Secondary | ICD-10-CM | POA: Diagnosis not present

## 2019-04-22 DIAGNOSIS — S53124A Posterior dislocation of right ulnohumeral joint, initial encounter: Secondary | ICD-10-CM | POA: Diagnosis not present

## 2019-04-22 DIAGNOSIS — R52 Pain, unspecified: Secondary | ICD-10-CM | POA: Diagnosis not present

## 2019-04-22 DIAGNOSIS — S53104A Unspecified dislocation of right ulnohumeral joint, initial encounter: Secondary | ICD-10-CM | POA: Diagnosis not present

## 2019-04-22 DIAGNOSIS — M85821 Other specified disorders of bone density and structure, right upper arm: Secondary | ICD-10-CM | POA: Diagnosis not present

## 2019-04-22 DIAGNOSIS — Z743 Need for continuous supervision: Secondary | ICD-10-CM | POA: Diagnosis not present

## 2019-04-22 DIAGNOSIS — Y9301 Activity, walking, marching and hiking: Secondary | ICD-10-CM | POA: Diagnosis not present

## 2019-04-22 DIAGNOSIS — S53104D Unspecified dislocation of right ulnohumeral joint, subsequent encounter: Secondary | ICD-10-CM | POA: Diagnosis not present

## 2019-04-22 DIAGNOSIS — R279 Unspecified lack of coordination: Secondary | ICD-10-CM | POA: Diagnosis not present

## 2019-04-22 DIAGNOSIS — Z20828 Contact with and (suspected) exposure to other viral communicable diseases: Secondary | ICD-10-CM | POA: Diagnosis not present

## 2019-04-22 DIAGNOSIS — W19XXXA Unspecified fall, initial encounter: Secondary | ICD-10-CM | POA: Diagnosis not present

## 2019-04-22 DIAGNOSIS — M19011 Primary osteoarthritis, right shoulder: Secondary | ICD-10-CM | POA: Diagnosis not present

## 2019-04-22 DIAGNOSIS — S53101A Unspecified subluxation of right ulnohumeral joint, initial encounter: Secondary | ICD-10-CM | POA: Diagnosis not present

## 2019-04-22 DIAGNOSIS — S52121A Displaced fracture of head of right radius, initial encounter for closed fracture: Secondary | ICD-10-CM | POA: Diagnosis not present

## 2019-04-22 DIAGNOSIS — R079 Chest pain, unspecified: Secondary | ICD-10-CM | POA: Diagnosis not present

## 2019-04-22 DIAGNOSIS — J811 Chronic pulmonary edema: Secondary | ICD-10-CM | POA: Diagnosis not present

## 2019-04-22 DIAGNOSIS — S42121A Displaced fracture of acromial process, right shoulder, initial encounter for closed fracture: Secondary | ICD-10-CM | POA: Diagnosis not present

## 2019-04-22 DIAGNOSIS — Y998 Other external cause status: Secondary | ICD-10-CM | POA: Diagnosis not present

## 2019-04-22 DIAGNOSIS — S42401A Unspecified fracture of lower end of right humerus, initial encounter for closed fracture: Secondary | ICD-10-CM | POA: Diagnosis not present

## 2019-04-24 DIAGNOSIS — S53104A Unspecified dislocation of right ulnohumeral joint, initial encounter: Secondary | ICD-10-CM | POA: Insufficient documentation

## 2019-04-24 HISTORY — DX: Unspecified dislocation of right ulnohumeral joint, initial encounter: S53.104A

## 2019-04-27 DIAGNOSIS — I110 Hypertensive heart disease with heart failure: Secondary | ICD-10-CM | POA: Diagnosis not present

## 2019-04-27 DIAGNOSIS — I5032 Chronic diastolic (congestive) heart failure: Secondary | ICD-10-CM | POA: Diagnosis not present

## 2019-04-29 DIAGNOSIS — S52124A Nondisplaced fracture of head of right radius, initial encounter for closed fracture: Secondary | ICD-10-CM | POA: Diagnosis not present

## 2019-04-29 DIAGNOSIS — S53194A Other dislocation of right ulnohumeral joint, initial encounter: Secondary | ICD-10-CM | POA: Diagnosis not present

## 2019-04-29 DIAGNOSIS — S53431A Radial collateral ligament sprain of right elbow, initial encounter: Secondary | ICD-10-CM | POA: Diagnosis not present

## 2019-04-29 DIAGNOSIS — S52041A Displaced fracture of coronoid process of right ulna, initial encounter for closed fracture: Secondary | ICD-10-CM | POA: Diagnosis not present

## 2019-04-29 DIAGNOSIS — S42401A Unspecified fracture of lower end of right humerus, initial encounter for closed fracture: Secondary | ICD-10-CM | POA: Diagnosis not present

## 2019-05-05 ENCOUNTER — Other Ambulatory Visit: Payer: Self-pay | Admitting: Cardiology

## 2019-05-05 DIAGNOSIS — Z780 Asymptomatic menopausal state: Secondary | ICD-10-CM | POA: Diagnosis not present

## 2019-05-05 DIAGNOSIS — M8580 Other specified disorders of bone density and structure, unspecified site: Secondary | ICD-10-CM | POA: Diagnosis not present

## 2019-05-05 DIAGNOSIS — S42401D Unspecified fracture of lower end of right humerus, subsequent encounter for fracture with routine healing: Secondary | ICD-10-CM | POA: Diagnosis not present

## 2019-05-05 DIAGNOSIS — I5030 Unspecified diastolic (congestive) heart failure: Secondary | ICD-10-CM | POA: Diagnosis not present

## 2019-05-05 DIAGNOSIS — I110 Hypertensive heart disease with heart failure: Secondary | ICD-10-CM | POA: Diagnosis not present

## 2019-05-10 DIAGNOSIS — M1811 Unilateral primary osteoarthritis of first carpometacarpal joint, right hand: Secondary | ICD-10-CM | POA: Diagnosis not present

## 2019-05-10 DIAGNOSIS — M79644 Pain in right finger(s): Secondary | ICD-10-CM

## 2019-05-10 DIAGNOSIS — S53104D Unspecified dislocation of right ulnohumeral joint, subsequent encounter: Secondary | ICD-10-CM | POA: Diagnosis not present

## 2019-05-10 DIAGNOSIS — S52041D Displaced fracture of coronoid process of right ulna, subsequent encounter for closed fracture with routine healing: Secondary | ICD-10-CM | POA: Diagnosis not present

## 2019-05-10 DIAGNOSIS — M19041 Primary osteoarthritis, right hand: Secondary | ICD-10-CM | POA: Diagnosis not present

## 2019-05-10 DIAGNOSIS — M25421 Effusion, right elbow: Secondary | ICD-10-CM | POA: Diagnosis not present

## 2019-05-10 DIAGNOSIS — M8588 Other specified disorders of bone density and structure, other site: Secondary | ICD-10-CM | POA: Diagnosis not present

## 2019-05-10 DIAGNOSIS — M20091 Other deformity of right finger(s): Secondary | ICD-10-CM | POA: Diagnosis not present

## 2019-05-10 DIAGNOSIS — S42401D Unspecified fracture of lower end of right humerus, subsequent encounter for fracture with routine healing: Secondary | ICD-10-CM | POA: Diagnosis not present

## 2019-05-10 DIAGNOSIS — J40 Bronchitis, not specified as acute or chronic: Secondary | ICD-10-CM | POA: Diagnosis not present

## 2019-05-10 DIAGNOSIS — M19031 Primary osteoarthritis, right wrist: Secondary | ICD-10-CM | POA: Diagnosis not present

## 2019-05-10 HISTORY — DX: Pain in right finger(s): M79.644

## 2019-05-18 DIAGNOSIS — M62531 Muscle wasting and atrophy, not elsewhere classified, right forearm: Secondary | ICD-10-CM | POA: Diagnosis not present

## 2019-05-18 DIAGNOSIS — M25521 Pain in right elbow: Secondary | ICD-10-CM | POA: Diagnosis not present

## 2019-05-18 DIAGNOSIS — M4004 Postural kyphosis, thoracic region: Secondary | ICD-10-CM | POA: Diagnosis not present

## 2019-05-18 DIAGNOSIS — M542 Cervicalgia: Secondary | ICD-10-CM | POA: Diagnosis not present

## 2019-05-18 DIAGNOSIS — M62521 Muscle wasting and atrophy, not elsewhere classified, right upper arm: Secondary | ICD-10-CM | POA: Diagnosis not present

## 2019-05-20 DIAGNOSIS — M4004 Postural kyphosis, thoracic region: Secondary | ICD-10-CM | POA: Diagnosis not present

## 2019-05-20 DIAGNOSIS — M62521 Muscle wasting and atrophy, not elsewhere classified, right upper arm: Secondary | ICD-10-CM | POA: Diagnosis not present

## 2019-05-20 DIAGNOSIS — M25521 Pain in right elbow: Secondary | ICD-10-CM | POA: Diagnosis not present

## 2019-05-20 DIAGNOSIS — M542 Cervicalgia: Secondary | ICD-10-CM | POA: Diagnosis not present

## 2019-05-20 DIAGNOSIS — M62531 Muscle wasting and atrophy, not elsewhere classified, right forearm: Secondary | ICD-10-CM | POA: Diagnosis not present

## 2019-05-25 DIAGNOSIS — M62521 Muscle wasting and atrophy, not elsewhere classified, right upper arm: Secondary | ICD-10-CM | POA: Diagnosis not present

## 2019-05-25 DIAGNOSIS — M25521 Pain in right elbow: Secondary | ICD-10-CM | POA: Diagnosis not present

## 2019-05-25 DIAGNOSIS — M542 Cervicalgia: Secondary | ICD-10-CM | POA: Diagnosis not present

## 2019-05-25 DIAGNOSIS — M4004 Postural kyphosis, thoracic region: Secondary | ICD-10-CM | POA: Diagnosis not present

## 2019-05-25 DIAGNOSIS — M62531 Muscle wasting and atrophy, not elsewhere classified, right forearm: Secondary | ICD-10-CM | POA: Diagnosis not present

## 2019-05-28 ENCOUNTER — Other Ambulatory Visit: Payer: Self-pay | Admitting: Cardiology

## 2019-05-31 ENCOUNTER — Encounter: Payer: Self-pay | Admitting: Cardiology

## 2019-05-31 ENCOUNTER — Other Ambulatory Visit: Payer: Self-pay

## 2019-05-31 ENCOUNTER — Ambulatory Visit (INDEPENDENT_AMBULATORY_CARE_PROVIDER_SITE_OTHER): Payer: Medicare Other | Admitting: Cardiology

## 2019-05-31 VITALS — BP 138/68 | HR 74 | Ht 60.0 in | Wt 175.0 lb

## 2019-05-31 DIAGNOSIS — I10 Essential (primary) hypertension: Secondary | ICD-10-CM

## 2019-05-31 DIAGNOSIS — I340 Nonrheumatic mitral (valve) insufficiency: Secondary | ICD-10-CM

## 2019-05-31 DIAGNOSIS — M79661 Pain in right lower leg: Secondary | ICD-10-CM | POA: Diagnosis not present

## 2019-05-31 DIAGNOSIS — I2510 Atherosclerotic heart disease of native coronary artery without angina pectoris: Secondary | ICD-10-CM | POA: Diagnosis not present

## 2019-05-31 DIAGNOSIS — E782 Mixed hyperlipidemia: Secondary | ICD-10-CM

## 2019-05-31 DIAGNOSIS — M79662 Pain in left lower leg: Secondary | ICD-10-CM

## 2019-05-31 NOTE — Patient Instructions (Addendum)
Medication Instructions:  No medication changes. *If you need a refill on your cardiac medications before your next appointment, please call your pharmacy*  Lab Work: None ordered. If you have labs (blood work) drawn today and your tests are completely normal, you will receive your results only by: Marland Kitchen MyChart Message (if you have MyChart) OR . A paper copy in the mail If you have any lab test that is abnormal or we need to change your treatment, we will call you to review the results.  Testing/Procedures: You need to have a bilateral ultrasound of your legs at [place] in [facility] on 06/02/19.  Follow-Up: At [facility], you and your health needs are our priority.  As part of our continuing mission to provide you with exceptional heart care, we have created designated Provider Care Teams.  These Care Teams include your primary Cardiologist (physician) and Advanced Practice Providers (APPs -  Physician Assistants and Nurse Practitioners) who all work together to provide you with the care you need, when you need it.  Your next appointment:   6 month(s)  The format for your next appointment:   In Person  Provider:   Jyl Heinz, MD  Other Instructions NA

## 2019-05-31 NOTE — Progress Notes (Signed)
Cardiology Office Note:    Date:  05/31/2019   ID:  Kelsey Rivera, DOB 02/17/1946, MRN [id number]  PCP:  Street, Sharon Mt, MD  Cardiologist:  [name], MD   Referring MD: [address], Sharon Mt, *    ASSESSMENT:    1. Coronary artery calcification seen on CT scan   2. Essential hypertension   3. Mixed hyperlipidemia   4. Mitral valve insufficiency, unspecified etiology    PLAN:    In order of problems listed above:  1. Coronary artery disease: Secondary prevention stressed with the patient.  Importance of compliance with diet and medication stressed and she vocalized understanding.  Diet was discussed for obesity and weight reduction was stressed and she promises to do so. 2. Essential hypertension: Blood pressure is stable 3. Mixed dyslipidemia: Diet was discussed.  Recent lab work with lipids were fine and I educated her about it. 4. Patient will be seen in follow-up appointment in 6 months or earlier if the patient has any concerns    Medication Adjustments/Labs and Tests Ordered: Current medicines are reviewed at length with the patient today.  Concerns regarding medicines are outlined above.  No orders of the defined types were placed in this encounter.  No orders of the defined types were placed in this encounter.    No chief complaint on file.    History of Present Illness:    Kelsey Rivera is a 74 y.o. female.  Patient has past medical history of coronary artery calcification, essential hypertension, dyslipidemia.  She denies any problems at this time and takes care of activities of daily living.  No chest pain orthopnea or PND.  Patient mentions to me that she had a fall and fractured her elbow and underwent surgery for this.  She is now trying to get back to her routine of walking.  At the time of my evaluation, the patient is alert awake oriented and in no distress.  Past Medical History:  Diagnosis Date  . Arthritis of foot, left 01/09/2016    . Baker's cyst of knee 02/06/2017  . Chest pain in adult 04/25/2015   Overview:  Lexiscan  MPS with normal perfusion and function, EF 52%  . Essential hypertension 04/26/2015  . Extensor tendonitis of foot 12/14/2015   Overview:  Left  . Hyperlipidemia 02/06/2017  . Injury of left foot 07/25/2015  . LBBB (left bundle branch block) 04/25/2015  . Migraine 02/06/2017  . Osteoarthritis 02/06/2017  . Osteoarthritis of left foot 11/14/2015  . Pathological fracture of metatarsal bone of left foot with routine healing 08/15/2015  . RLS (restless legs syndrome) 04/26/2015    Past Surgical History:  Procedure Laterality Date  . ABDOMINAL HYSTERECTOMY    . CHOLECYSTECTOMY    . FOOT FRACTURE SURGERY    . TONSILLECTOMY    . VEIN SURGERY      Current Medications: Current Meds  Medication Sig  . albuterol (VENTOLIN HFA) 108 (90 Base) MCG/ACT inhaler   . amLODipine (NORVASC) 10 MG tablet Take 10 mg by mouth daily.  Marland Kitchen aspirin EC 81 MG tablet Take 1 tablet (81 mg total) by mouth daily.  Marland Kitchen atorvastatin (LIPITOR) 20 MG tablet Take 1 tablet (20 mg total) by mouth daily.  . Calcium Carbonate-Vitamin D 600-400 MG-UNIT tablet TAKE 2 (TWO) TABLET BY MOUTH DAILY FOR OSTEOPENIA  . diazepam (VALIUM) 10 MG tablet TAKE 1 TABLET BY MOUTH AT BEDTIME EVERY OTHER NIGHT FOR ANXIETY  . escitalopram (LEXAPRO) 5 MG tablet Take  5 mg by mouth daily.  Marland Kitchen esomeprazole (NEXIUM) 20 MG capsule Take 20 mg by mouth daily.   . fluticasone (FLONASE) 50 MCG/ACT nasal spray Place 1 spray into both nostrils daily as needed.  . gabapentin (NEURONTIN) 300 MG capsule take 1 capsule by mouth at bedtime  . hydrALAZINE (APRESOLINE) 25 MG tablet TAKE 0.5 TABLETS (12.5 MG TOTAL) BY MOUTH 3 (THREE) TIMES DAILY.  . hydrochlorothiazide (HYDRODIURIL) 25 MG tablet TAKE 1 TABLET BY MOUTH EVERY DAY  . HYDROcodone-acetaminophen (NORCO/VICODIN) 5-325 MG tablet TAKE 1 TO 2 TABLETS BY MOUTH EVERY 8 HOURS AS NEEDED  . hydrOXYzine (ATARAX/VISTARIL)  25 MG tablet Take 25 mg by mouth daily as needed.  . methocarbamol (ROBAXIN) 500 MG tablet TAKE 2 (TWO) TABLET THREE TIMES DAILY AS NEEDED FOR BACK SPASMS  . rOPINIRole (REQUIP) 4 MG tablet Take 2 tablets by mouth daily.  Marland Kitchen telmisartan (MICARDIS) 80 MG tablet Take 80 mg by mouth daily.  Marland Kitchen zolpidem (AMBIEN) 5 MG tablet Take 5 mg by mouth at bedtime as needed.      Allergies:   Sulfa antibiotics   Social History   Socioeconomic History  . Marital status: Married    Spouse name: Not on file  . Number of children: Not on file  . Years of education: Not on file  . Highest education level: Not on file  Occupational History  . Not on file  Tobacco Use  . Smoking status: Never Smoker  . Smokeless tobacco: Never Used  Substance and Sexual Activity  . Alcohol use: No  . Drug use: No  . Sexual activity: Not on file  Other Topics Concern  . Not on file  Social History Narrative  . Not on file   Social Determinants of Health   Financial Resource Strain:   . Difficulty of Paying Living Expenses: Not on file  Food Insecurity:   . Worried About Charity fundraiser in the Last Year: Not on file  . Ran Out of Food in the Last Year: Not on file  Transportation Needs:   . Lack of Transportation (Medical): Not on file  . Lack of Transportation (Non-Medical): Not on file  Physical Activity:   . Days of Exercise per Week: Not on file  . Minutes of Exercise per Session: Not on file  Stress:   . Feeling of Stress : Not on file  Social Connections:   . Frequency of Communication with Friends and Family: Not on file  . Frequency of Social Gatherings with Friends and Family: Not on file  . Attends Religious Services: Not on file  . Active Member of Clubs or Organizations: Not on file  . Attends Archivist Meetings: Not on file  . Marital Status: Not on file     Family History: The patient's family history includes CAD in her brother; Cancer in her brother; Heart attack in her  father and mother; Pulmonary embolism in her brother.  ROS:   Please see the history of present illness.    All other systems reviewed and are negative.  EKGs/Labs/Other Studies Reviewed:    The following studies were reviewed today: I reviewed records from previous evaluations   Recent Labs: 11/20/2018: BUN 19; Creatinine, Ser 0.77; Potassium 4.3; Sodium 140; TSH 1.360 02/10/2019: ALT 9  Recent Lipid Panel    Component Value Date/Time   CHOL 144 02/10/2019 0816   TRIG 54 02/10/2019 0816   HDL 70 02/10/2019 0816   CHOLHDL 2.1 02/10/2019 0816  [place] 62 02/10/2019 0816    Physical Exam:    VS:  BP 138/68   Pulse 74   Ht 5' (1.524 m)   Wt 175 lb (79.4 kg)   SpO2 95%   BMI 34.18 kg/m     Wt Readings from Last 3 Encounters:  05/31/19 175 lb (79.4 kg)  11/19/18 168 lb (76.2 kg)  10/22/18 170 lb (77.1 kg)     GEN: Patient is in no acute distress HEENT: Normal NECK: No JVD; No carotid bruits LYMPHATICS: No lymphadenopathy CARDIAC: Hear sounds regular, 2/6 systolic murmur at the apex. RESPIRATORY:  Clear to auscultation without rales, wheezing or rhonchi  ABDOMEN: Soft, non-tender, non-distended MUSCULOSKELETAL:  No edema; No deformity  SKIN: Warm and dry NEUROLOGIC:  Alert and oriented x 3 PSYCHIATRIC:  Normal affect   Signed, [name], MD  05/31/2019 1:34 PM    [facility]

## 2019-06-01 DIAGNOSIS — M25521 Pain in right elbow: Secondary | ICD-10-CM | POA: Diagnosis not present

## 2019-06-01 DIAGNOSIS — M62521 Muscle wasting and atrophy, not elsewhere classified, right upper arm: Secondary | ICD-10-CM | POA: Diagnosis not present

## 2019-06-01 DIAGNOSIS — M4004 Postural kyphosis, thoracic region: Secondary | ICD-10-CM | POA: Diagnosis not present

## 2019-06-01 DIAGNOSIS — M542 Cervicalgia: Secondary | ICD-10-CM | POA: Diagnosis not present

## 2019-06-01 DIAGNOSIS — M62531 Muscle wasting and atrophy, not elsewhere classified, right forearm: Secondary | ICD-10-CM | POA: Diagnosis not present

## 2019-06-02 ENCOUNTER — Other Ambulatory Visit: Payer: Self-pay

## 2019-06-02 ENCOUNTER — Ambulatory Visit (HOSPITAL_BASED_OUTPATIENT_CLINIC_OR_DEPARTMENT_OTHER)
Admission: RE | Admit: 2019-06-02 | Discharge: 2019-06-02 | Disposition: A | Discharge status: Home / Self Care | Payer: Medicare Other | Source: Ambulatory Visit | Attending: Cardiology | Admitting: Cardiology

## 2019-06-02 DIAGNOSIS — I340 Nonrheumatic mitral (valve) insufficiency: Secondary | ICD-10-CM | POA: Diagnosis not present

## 2019-06-02 DIAGNOSIS — E782 Mixed hyperlipidemia: Secondary | ICD-10-CM

## 2019-06-02 DIAGNOSIS — I2510 Atherosclerotic heart disease of native coronary artery without angina pectoris: Secondary | ICD-10-CM

## 2019-06-02 DIAGNOSIS — I10 Essential (primary) hypertension: Secondary | ICD-10-CM | POA: Diagnosis not present

## 2019-06-02 DIAGNOSIS — M79661 Pain in right lower leg: Secondary | ICD-10-CM

## 2019-06-02 DIAGNOSIS — M79662 Pain in left lower leg: Secondary | ICD-10-CM | POA: Diagnosis not present

## 2019-06-02 NOTE — Progress Notes (Signed)
VAS [place] LOWER EXTREMITY VENOUS BILAT (DVT)     06/02/19 Cardell Peach RDCS, RVT

## 2019-06-03 DIAGNOSIS — M25521 Pain in right elbow: Secondary | ICD-10-CM | POA: Diagnosis not present

## 2019-06-03 DIAGNOSIS — M62521 Muscle wasting and atrophy, not elsewhere classified, right upper arm: Secondary | ICD-10-CM | POA: Diagnosis not present

## 2019-06-03 DIAGNOSIS — M62531 Muscle wasting and atrophy, not elsewhere classified, right forearm: Secondary | ICD-10-CM | POA: Diagnosis not present

## 2019-06-03 DIAGNOSIS — M542 Cervicalgia: Secondary | ICD-10-CM | POA: Diagnosis not present

## 2019-06-03 DIAGNOSIS — M4004 Postural kyphosis, thoracic region: Secondary | ICD-10-CM | POA: Diagnosis not present

## 2019-06-05 ENCOUNTER — Emergency Department (HOSPITAL_COMMUNITY)
Admission: EM | Admit: 2019-06-05 | Discharge: 2019-06-05 | Disposition: A | Discharge status: Home / Self Care | Payer: Medicare Other | Attending: Emergency Medicine | Admitting: Emergency Medicine

## 2019-06-05 ENCOUNTER — Emergency Department (HOSPITAL_COMMUNITY): Payer: Medicare Other

## 2019-06-05 ENCOUNTER — Other Ambulatory Visit: Payer: Self-pay

## 2019-06-05 ENCOUNTER — Encounter (HOSPITAL_COMMUNITY): Payer: Self-pay

## 2019-06-05 DIAGNOSIS — Z7982 Long term (current) use of aspirin: Secondary | ICD-10-CM | POA: Insufficient documentation

## 2019-06-05 DIAGNOSIS — Z79899 Other long term (current) drug therapy: Secondary | ICD-10-CM | POA: Insufficient documentation

## 2019-06-05 DIAGNOSIS — N183 Chronic kidney disease, stage 3 unspecified: Secondary | ICD-10-CM | POA: Diagnosis not present

## 2019-06-05 DIAGNOSIS — I129 Hypertensive chronic kidney disease with stage 1 through stage 4 chronic kidney disease, or unspecified chronic kidney disease: Secondary | ICD-10-CM | POA: Insufficient documentation

## 2019-06-05 DIAGNOSIS — J441 Chronic obstructive pulmonary disease with (acute) exacerbation: Secondary | ICD-10-CM | POA: Diagnosis not present

## 2019-06-05 DIAGNOSIS — I200 Unstable angina: Secondary | ICD-10-CM | POA: Diagnosis not present

## 2019-06-05 DIAGNOSIS — Z20822 Contact with and (suspected) exposure to covid-19: Secondary | ICD-10-CM | POA: Insufficient documentation

## 2019-06-05 DIAGNOSIS — R0602 Shortness of breath: Secondary | ICD-10-CM

## 2019-06-05 LAB — POC SARS CORONAVIRUS 2 AG -  ED: SARS Coronavirus 2 Ag: NEGATIVE

## 2019-06-05 LAB — BASIC METABOLIC PANEL
Anion gap: 12 (ref 5–15)
BUN: 20 mg/dL (ref 8–23)
CO2: 25 mmol/L (ref 22–32)
Calcium: 9.8 mg/dL (ref 8.9–10.3)
Chloride: 104 mmol/L (ref 98–111)
Creatinine, Ser: 1.04 mg/dL — ABNORMAL HIGH (ref 0.44–1.00)
GFR calc Af Amer: >60 mL/min (ref >60)
GFR calc non Af Amer: 53 mL/min — ABNORMAL LOW (ref >60)
Glucose, Bld: 150 mg/dL — ABNORMAL HIGH (ref 70–99)
Potassium: 3.8 mmol/L (ref 3.5–5.1)
Sodium: 141 mmol/L (ref 135–145)

## 2019-06-05 LAB — TROPONIN I (HIGH SENSITIVITY)
Troponin I (High Sensitivity): 6 ng/L (ref <18)
Troponin I (High Sensitivity): 7 ng/L (ref <18)

## 2019-06-05 LAB — CBC
HCT: 39.3 % (ref 36.0–46.0)
Hemoglobin: 12.5 g/dL (ref 12.0–15.0)
MCH: 31 pg (ref 26.0–34.0)
MCHC: 31.8 g/dL (ref 30.0–36.0)
MCV: 97.5 fL (ref 80.0–100.0)
Platelets: 323 10*3/uL (ref 150–400)
RBC: 4.03 MIL/uL (ref 3.87–5.11)
RDW: 14 % (ref 11.5–15.5)
WBC: 7.5 10*3/uL (ref 4.0–10.5)
nRBC: 0 % (ref 0.0–0.2)

## 2019-06-05 MED ORDER — PREDNISONE 20 MG PO TABS: 40.0000 mg | ORAL_TABLET | Freq: Every day | ORAL | 0 refills | Status: AC

## 2019-06-05 MED ORDER — METHYLPREDNISOLONE SODIUM SUCC 125 MG IJ SOLR
125.0000 mg | Freq: Once | INTRAMUSCULAR | Status: AC
  Administered 2019-06-05: 125 mg via INTRAVENOUS
  Filled 2019-06-05: qty 2

## 2019-06-05 MED ORDER — IPRATROPIUM BROMIDE HFA 17 MCG/ACT IN AERS
4.0000 | INHALATION_SPRAY | Freq: Once | RESPIRATORY_TRACT | Status: AC
  Administered 2019-06-05: 4 via RESPIRATORY_TRACT
  Filled 2019-06-05: qty 12.9

## 2019-06-05 NOTE — ED Provider Notes (Addendum)
[facility] EMERGENCY DEPARTMENT Provider Note   CSN: [id number] Arrival date & time: 06/05/19  1404     History Chief Complaint  Patient presents with   Chest Pain   Shortness of Breath    Kelsey Rivera is a 74 y.o. female.  74 y.o female with a PMH of COPD,  Mitral regurgitation, presents to the ED sent in by urgent care due to abnormal EKG.  Patient reports she has been having URI symptoms such as a runny nose, reports she now feels like this is settling into her chest.  She describes a substernal chest pressure which feels like her cat is sitting on there.  She reports this has been going on since yesterday and is unchanged.  She does have shortness of breath, does report a previous history of COPD.  Use her inhaler yesterday with mild improvement.  She has not used her inhaler today.  According to her chart, she does have a history of coronary artery calcifications, no previous MI in the past.  He denies any fever, cough, abdominal pain or other complaints.   Chest tgightness x    The history is provided by the patient.  Chest Pain Pain location:  Substernal area Pain quality: pressure   Pain radiates to:  Does not radiate Pain severity:  Mild Onset quality:  Gradual Duration:  1 day Timing:  Constant Progression:  Unchanged Associated symptoms: shortness of breath   Associated symptoms: no abdominal pain, no back pain, no fever, no headache, no nausea and no vomiting   Risk factors: coronary artery disease, hypertension and surgery   Risk factors: no diabetes mellitus, no Ehlers-Danlos syndrome, not female, no prior DVT/PE and no smoking   Shortness of Breath Associated symptoms: no abdominal pain, no chest pain, no fever, no headaches, no sore throat and no vomiting        Past Medical History:  Diagnosis Date   Arthritis of foot, left 01/09/2016   Baker's cyst of knee 02/06/2017   Chest pain in adult 04/25/2015   Overview:  Lexiscan  MPS  with normal perfusion and function, EF 52%   Essential hypertension 04/26/2015   Extensor tendonitis of foot 12/14/2015   Overview:  Left   Hyperlipidemia 02/06/2017   Injury of left foot 07/25/2015   LBBB (left bundle branch block) 04/25/2015   Migraine 02/06/2017   Osteoarthritis 02/06/2017   Osteoarthritis of left foot 11/14/2015   Pathological fracture of metatarsal bone of left foot with routine healing 08/15/2015   RLS (restless legs syndrome) 04/26/2015    Patient Active Problem List   Diagnosis Date Noted   Coronary artery calcification seen on CT scan 10/22/2018   AKI (acute kidney injury) ([place]) 05/20/2018   Bilateral lower extremity edema 05/20/2018   Chronic diastolic (congestive) heart failure ([place]) 05/20/2018   CKD (chronic kidney disease) stage 3, GFR 30-59 ml/min 05/20/2018   Mitral regurgitation 12/17/2017   Plantar fasciitis of left foot 06/03/2017   Venous ulcer of left leg ([place]) 02/19/2017   Hyperlipidemia 02/06/2017   Migraine 02/06/2017   Osteoarthritis 02/06/2017   Baker's cyst of knee 02/06/2017   Arthritis of foot, left 01/09/2016   Extensor tendonitis of foot 12/14/2015   Osteoarthritis of left foot 11/14/2015   Pathological fracture of metatarsal bone of left foot with routine healing 08/15/2015   Injury of left foot 07/25/2015   Essential hypertension 04/26/2015   RLS (restless legs syndrome) 04/26/2015   Chest pain in adult 04/25/2015  LBBB (left bundle branch block) 04/25/2015    Past Surgical History:  Procedure Laterality Date   ABDOMINAL HYSTERECTOMY     CHOLECYSTECTOMY     FOOT FRACTURE SURGERY     TONSILLECTOMY     VEIN SURGERY       OB History   No obstetric history on file.     Family History  Problem Relation Age of Onset   Heart attack Mother    Heart attack Father    Cancer Brother    CAD Brother    Pulmonary embolism Brother     Social History   Tobacco Use   Smoking status:  Never Smoker   Smokeless tobacco: Never Used  Substance Use Topics   Alcohol use: No   Drug use: No    Home Medications Prior to Admission medications   Medication Sig Start Date End Date Taking? Authorizing Provider  albuterol (VENTOLIN HFA) 108 (90 Base) MCG/ACT inhaler  05/28/19   [provider]  amLODipine (NORVASC) 10 MG tablet Take 10 mg by mouth daily. 03/15/18   [provider]  aspirin EC 81 MG tablet Take 1 tablet (81 mg total) by mouth daily. 10/22/18   Revankar, Reita Cliche, MD  atorvastatin (LIPITOR) 20 MG tablet Take 1 tablet (20 mg total) by mouth daily. 12/30/18 05/31/19  Revankar, Reita Cliche, MD  Calcium Carbonate-Vitamin D 600-400 MG-UNIT tablet TAKE 2 (TWO) TABLET BY MOUTH DAILY FOR OSTEOPENIA 03/05/18   [provider]  diazepam (VALIUM) 10 MG tablet TAKE 1 TABLET BY MOUTH AT BEDTIME EVERY OTHER NIGHT FOR ANXIETY 11/22/16   [provider]  escitalopram (LEXAPRO) 5 MG tablet Take 5 mg by mouth daily. 05/07/18   [provider]  esomeprazole (NEXIUM) 20 MG capsule Take 20 mg by mouth daily.     [provider]  fluticasone (FLONASE) 50 MCG/ACT nasal spray Place 1 spray into both nostrils daily as needed. 02/27/18   [provider]  gabapentin (NEURONTIN) 300 MG capsule take 1 capsule by mouth at bedtime 05/23/15   [provider]  hydrALAZINE (APRESOLINE) 25 MG tablet TAKE 0.5 TABLETS (12.5 MG TOTAL) BY MOUTH 3 (THREE) TIMES DAILY. 05/28/19   Revankar, Reita Cliche, MD  hydrochlorothiazide (HYDRODIURIL) 25 MG tablet TAKE 1 TABLET BY MOUTH EVERY DAY 02/21/17   [provider]  HYDROcodone-acetaminophen (NORCO/VICODIN) 5-325 MG tablet TAKE 1 TO 2 TABLETS BY MOUTH EVERY 8 HOURS AS NEEDED 04/29/19   [provider]  hydrOXYzine (ATARAX/VISTARIL) 25 MG tablet Take 25 mg by mouth daily as needed.    [provider]  methocarbamol (ROBAXIN) 500 MG tablet TAKE 2 (TWO) TABLET THREE TIMES DAILY AS NEEDED  FOR BACK SPASMS 07/12/16   [provider]  nitroGLYCERIN (NITROSTAT) 0.4 MG SL tablet PLACE 1 TABLET (0.4 MG TOTAL) UNDER THE TONGUE EVERY 5 (FIVE) MINUTES AS NEEDED. 02/19/19 05/20/19  Revankar, Reita Cliche, MD  predniSONE (DELTASONE) 20 MG tablet Take 2 tablets (40 mg total) by mouth daily for 5 days. 06/05/19 06/10/19  Janeece Fitting, PA-C  rOPINIRole (REQUIP) 4 MG tablet Take 2 tablets by mouth daily. 03/03/15   [provider]  telmisartan (MICARDIS) 80 MG tablet Take 80 mg by mouth daily. 04/13/18   [provider]  zolpidem (AMBIEN) 5 MG tablet Take 5 mg by mouth at bedtime as needed.  11/14/15   [provider]    Allergies    Sulfa antibiotics  Review of Systems   Review of Systems  Constitutional:  Positive for chills. Negative for fever.  HENT: Positive for rhinorrhea and sinus pressure. Negative for sore throat.   Respiratory: Positive for chest tightness and shortness of breath.   Cardiovascular: Negative for chest pain and leg swelling.  Gastrointestinal: Negative for abdominal pain, diarrhea, nausea and vomiting.  Genitourinary: Negative for flank pain.  Musculoskeletal: Negative for back pain.  Skin: Negative for pallor and wound.  Neurological: Negative for syncope, light-headedness and headaches.    Physical Exam Updated Vital Signs BP (!) 153/62    Pulse 72    Temp 97.7 F (36.5 C) (Oral)    Resp 20    SpO2 96%   Physical Exam Vitals and nursing note reviewed.  Constitutional:      General: She is not in acute distress.    Appearance: She is well-developed.  HENT:     Head: Normocephalic and atraumatic.     Mouth/Throat:     Pharynx: No oropharyngeal exudate.  Eyes:     Pupils: Pupils are equal, round, and reactive to light.  Cardiovascular:     Rate and Rhythm: Regular rhythm.     Heart sounds: Normal heart sounds.  Pulmonary:     Effort: Pulmonary effort is normal. No respiratory distress.     Breath sounds: Examination of the  right-lower field reveals decreased breath sounds. Examination of the left-lower field reveals decreased breath sounds. Decreased breath sounds and rhonchi present.  Abdominal:     General: Bowel sounds are normal. There is no distension.     Palpations: Abdomen is soft.     Tenderness: There is no abdominal tenderness.  Musculoskeletal:        General: No deformity.     Cervical back: Normal range of motion.     Right lower leg: No edema.     Left lower leg: Swelling and tenderness present. No edema.       Legs:     Comments: TTP of left calf, swelling noted to the right extremity.   Skin:    General: Skin is warm and dry.  Neurological:     Mental Status: She is alert and oriented to person, place, and time.     ED Results / Procedures / Treatments   Labs (all labs ordered are listed, but only abnormal results are displayed) Labs Reviewed  BASIC METABOLIC PANEL - Abnormal; Notable for the following components:      Result Value   Glucose, Bld 150 (*)    Creatinine, Ser 1.04 (*)    GFR calc non Af Amer 53 (*)    All other components within normal limits  CBC  POC SARS CORONAVIRUS 2 AG -  ED  TROPONIN I (HIGH SENSITIVITY)  TROPONIN I (HIGH SENSITIVITY)    EKG EKG Interpretation  Date/Time:  Saturday June 05 2019 14:05:57 EST Ventricular Rate:  74 PR Interval:  206 QRS Duration: 144 QT Interval:  394 QTC Calculation: 437 R Axis:   113 Text Interpretation: Normal sinus rhythm Left bundle branch block Does not meet Sgar[place] criteria for STEMI, isolated ST elevation in aVL single lead, no prior ecg for comparison Confirmed by Octaviano Glow [phone]) on 06/05/2019 2:15:59 PM   Radiology DG Chest Portable 1 View  Result Date: 06/05/2019 CLINICAL DATA:  Shortness of breath EXAM: PORTABLE CHEST 1 VIEW COMPARISON:  10/07/2018 FINDINGS: Heart is borderline in size. Lungs clear. No effusions or edema. No acute bony abnormality. IMPRESSION: No acute cardiopulmonary disease.  Electronically Signed   By: Lennette Bihari  [place] M.D.   On: 06/05/2019 16:21    Procedures Procedures (including critical care time)  Medications Ordered in ED Medications  ipratropium (ATROVENT HFA) inhaler 4 puff (4 puffs Inhalation Given 06/05/19 1600)  methylPREDNISolone sodium succinate (SOLU-MEDROL) 125 mg/2 mL injection 125 mg (125 mg Intravenous Given 06/05/19 1535)    ED Course  I have reviewed the triage vital signs and the nursing notes.  Pertinent labs & imaging results that were available during my care of the patient were reviewed by me and considered in my medical decision making (see chart for details).  Clinical Course as of Jun 04 1645  Sat Jun 04, 9465  [id number] 74 year old lady complaining of dyspnea on exertion for the past few days.  Also has some upper respiratory symptoms.  History of COPD.  Lab work cardiac work unremarkable.  Sats have been good.  Likely outpatient treatment and follow-up.   [MB]    Clinical Course User Index [MB] Hayden Rasmussen, MD   MDM Rules/Calculators/A&P     Patient with a past medical history of COPD, coronary artery calcifications presents to the ED Sunday by urgent care for chest pain.  Patient reports having URI symptoms for the past couple of days, noted her sinus pressure yesterday which now has settled in her chest.  She reports after having EKG along with x-ray at urgent care she was referred to the ED for further evaluation of her abnormal EKG.  Patient does have a history of COPD, has been using her inhaler but did not use this today.  Reports using it yesterday with some improvement in her symptoms.  She denies any Covid exposures, fevers, abdominal pain, sick contacts.  She does describe a pressure to the substernal area, describes this as like holding her cat.  He does have shortness of breath but this is not a new complaint.  She is complaining of some left lower extremity swelling along with pain.  Acording to patient she had an  ultrasound performed but has not gotten the results yet.  On my evaluation patient is well-appearing, sinus congestion noted, lungs are diminished to auscultation with some rhonchi present.  He does not have any previous history of diabetes, will provide her with methylprednisolone.  Patient did show concern for her bilateral lower extremity swelling, I have reviewed her chart she had an ultrasound DVT study 3 days ago, these results were negative.  Will not be repeating imaging.  These results were communicated with patient.  CBC showed no leukocytosis, globin is within normal limits.  BMP without any electrolyte abnormality, creatinine level slightly elevated on today's visit.  First troponin was negative.  EKG did show a left bundle branch block, she does have a previous history of this in 2016.  No previous EKG for comparison.   X-ray without any consolation, pneumothorax, pleural effusion.  HEART Score 4  Lower suspicion for ACS as patient reports the symptoms began gradually, starting with her sinus pressure which she reports congestion then moved onto her chest.  She reports improvement in symptoms after inhaler along with methylprednisolone.  We will send patient home on a short course of steroids to help with her exacerbation.  Patient understands and agrees with management.  Return precautions discussed at length.  Patient stable for discharge.   Portions of this note were generated with [employer]. Dictation errors may occur despite best attempts at proofreading.  Final Clinical Impression(s) / ED Diagnoses Final diagnoses:  Shortness of breath  COPD  exacerbation [facility])    Rx / DC Orders ED Discharge Orders         Ordered    predniSONE (DELTASONE) 20 MG tablet  Daily     06/05/19 1612           Janeece Fitting, PA-C 06/05/19 1730    Janeece Fitting, PA-C 06/05/19 1730    Hayden Rasmussen, MD 06/05/19 2218

## 2019-06-05 NOTE — ED Triage Notes (Signed)
Pt sent from UC in [place] for further evaluation of chest tightness and sob that has been going on for longer than a week. Pt denies any known COVID contacts. Resp e/u at this time, pt a.o

## 2019-06-05 NOTE — ED Notes (Signed)
Patient verbalizes understanding of discharge instructions. Opportunity for questioning and answers were provided. Armband removed by staff, pt discharged from ED ambulatory.   

## 2019-06-05 NOTE — Discharge Instructions (Addendum)
Your laboratory results along with your chest x-ray were within normal limits today. Your Covid 19 test was negative.  I prescribed a short course of steroids to help with your chest congestion, please take 2 tablets daily for the next 5 days.  If you experience any chest pain, shortness of breath, worsening symptoms please return to the emergency department.

## 2019-06-09 ENCOUNTER — Telehealth: Payer: Self-pay | Admitting: Cardiology

## 2019-06-09 DIAGNOSIS — M25521 Pain in right elbow: Secondary | ICD-10-CM | POA: Diagnosis not present

## 2019-06-09 DIAGNOSIS — M542 Cervicalgia: Secondary | ICD-10-CM | POA: Diagnosis not present

## 2019-06-09 DIAGNOSIS — M62521 Muscle wasting and atrophy, not elsewhere classified, right upper arm: Secondary | ICD-10-CM | POA: Diagnosis not present

## 2019-06-09 DIAGNOSIS — M62531 Muscle wasting and atrophy, not elsewhere classified, right forearm: Secondary | ICD-10-CM | POA: Diagnosis not present

## 2019-06-09 DIAGNOSIS — M4004 Postural kyphosis, thoracic region: Secondary | ICD-10-CM | POA: Diagnosis not present

## 2019-06-09 NOTE — Telephone Encounter (Signed)
New Message  Patient returning call about results. Please give patient a call back.

## 2019-06-09 NOTE — Telephone Encounter (Signed)
Called and gave pt results. Will send to scheduling to make appt.

## 2019-06-10 DIAGNOSIS — J40 Bronchitis, not specified as acute or chronic: Secondary | ICD-10-CM | POA: Diagnosis not present

## 2019-06-15 ENCOUNTER — Emergency Department (HOSPITAL_COMMUNITY): Payer: Medicare Other

## 2019-06-15 ENCOUNTER — Emergency Department (HOSPITAL_COMMUNITY)
Admission: EM | Admit: 2019-06-15 | Discharge: 2019-06-16 | Disposition: A | Discharge status: Home / Self Care | Payer: Medicare Other | Attending: Emergency Medicine | Admitting: Emergency Medicine

## 2019-06-15 DIAGNOSIS — Z9049 Acquired absence of other specified parts of digestive tract: Secondary | ICD-10-CM | POA: Insufficient documentation

## 2019-06-15 DIAGNOSIS — Z7982 Long term (current) use of aspirin: Secondary | ICD-10-CM | POA: Insufficient documentation

## 2019-06-15 DIAGNOSIS — N183 Chronic kidney disease, stage 3 unspecified: Secondary | ICD-10-CM | POA: Insufficient documentation

## 2019-06-15 DIAGNOSIS — I10 Essential (primary) hypertension: Secondary | ICD-10-CM | POA: Diagnosis not present

## 2019-06-15 DIAGNOSIS — I130 Hypertensive heart and chronic kidney disease with heart failure and stage 1 through stage 4 chronic kidney disease, or unspecified chronic kidney disease: Secondary | ICD-10-CM | POA: Diagnosis not present

## 2019-06-15 DIAGNOSIS — R079 Chest pain, unspecified: Secondary | ICD-10-CM | POA: Diagnosis not present

## 2019-06-15 DIAGNOSIS — R1111 Vomiting without nausea: Secondary | ICD-10-CM | POA: Diagnosis not present

## 2019-06-15 DIAGNOSIS — I5032 Chronic diastolic (congestive) heart failure: Secondary | ICD-10-CM | POA: Insufficient documentation

## 2019-06-15 DIAGNOSIS — R112 Nausea with vomiting, unspecified: Secondary | ICD-10-CM | POA: Insufficient documentation

## 2019-06-15 DIAGNOSIS — R0789 Other chest pain: Secondary | ICD-10-CM | POA: Diagnosis not present

## 2019-06-15 DIAGNOSIS — Z79899 Other long term (current) drug therapy: Secondary | ICD-10-CM | POA: Insufficient documentation

## 2019-06-15 DIAGNOSIS — I447 Left bundle-branch block, unspecified: Secondary | ICD-10-CM | POA: Diagnosis not present

## 2019-06-15 NOTE — ED Notes (Signed)
Patient friend (states she is POA unable to confirm this) but would like a call back on patient Please call  Vicie Mutters [phone]

## 2019-06-15 NOTE — ED Triage Notes (Signed)
Pt arrives via [facility], from home.   At 2115 pt had 10/10 sharp central chest pain  At 2130 pt had brownish emesis.  Pt given 324 of aspirin with EMS.  Pain on arrival 4/10.   A&ox4

## 2019-06-15 NOTE — ED Provider Notes (Signed)
MSE was initiated and I personally evaluated the patient and placed orders (if any) at  10:55 PM on June 15, 2019.  The patient appears stable so that the remainder of the MSE may be completed by another provider.  Patient with history of hypertension, hyperlipidemia, mitral regurgitation, CKD, CAD presents for evaluation of acute onset, persistent substernal chest pains beginning around 9:15 PM.  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  Reports pain is sharp, radiates across the chest and at times to the back.  Had some associated shortness of breath with this.  Denied lightheadedness, diaphoresis, or syncope.  No abdominal pain.  About 10 minutes after she developed chest pain she developed nausea and had 3 episodes of nonbloody but brown emesis.  She was given 324 mg of aspirin with EMS.  Reports pain is now 4/10 in severity but was initially 10/10.  It has been constant.  Physical Exam  Constitutional: She is well-developed, well-nourished, and in no distress.  HENT:  Head: Normocephalic and atraumatic.  Cardiovascular: Normal rate, regular rhythm and intact distal pulses.  2+ radial and DP/PT pulses bilaterally, Homans sign absent bilaterally, no lower extremity edema, no palpable cords, compartments are soft   Pulmonary/Chest: Effort normal.  Abdominal: Soft. Bowel sounds are normal. There is no abdominal tenderness.  Musculoskeletal:     Cervical back: Normal range of motion and neck supple.  Neurological: She is alert.   Patient resting comfortably in no apparent distress.  Will obtain EKG, chest x-ray, lab work.  Nils Flack, Gavin Pound, PA-C 06/15/19 [place], [place], DO 06/15/19 2318

## 2019-06-16 ENCOUNTER — Other Ambulatory Visit: Payer: Self-pay

## 2019-06-16 DIAGNOSIS — I10 Essential (primary) hypertension: Secondary | ICD-10-CM

## 2019-06-16 DIAGNOSIS — I2510 Atherosclerotic heart disease of native coronary artery without angina pectoris: Secondary | ICD-10-CM

## 2019-06-16 DIAGNOSIS — I447 Left bundle-branch block, unspecified: Secondary | ICD-10-CM

## 2019-06-16 DIAGNOSIS — I340 Nonrheumatic mitral (valve) insufficiency: Secondary | ICD-10-CM

## 2019-06-16 LAB — COMPREHENSIVE METABOLIC PANEL
ALT: 12 U/L (ref 0–44)
AST: 17 U/L (ref 15–41)
Albumin: 3.8 g/dL (ref 3.5–5.0)
Alkaline Phosphatase: 70 U/L (ref 38–126)
Anion gap: 13 (ref 5–15)
BUN: 21 mg/dL (ref 8–23)
CO2: 25 mmol/L (ref 22–32)
Calcium: 9.3 mg/dL (ref 8.9–10.3)
Chloride: 103 mmol/L (ref 98–111)
Creatinine, Ser: 1.28 mg/dL — ABNORMAL HIGH (ref 0.44–1.00)
GFR calc Af Amer: 48 mL/min — ABNORMAL LOW (ref >60)
GFR calc non Af Amer: 41 mL/min — ABNORMAL LOW (ref >60)
Glucose, Bld: 130 mg/dL — ABNORMAL HIGH (ref 70–99)
Potassium: 3.9 mmol/L (ref 3.5–5.1)
Sodium: 141 mmol/L (ref 135–145)
Total Bilirubin: 0.6 mg/dL (ref 0.3–1.2)
Total Protein: 6.9 g/dL (ref 6.5–8.1)

## 2019-06-16 LAB — CBC
HCT: 38.9 % (ref 36.0–46.0)
Hemoglobin: 12.2 g/dL (ref 12.0–15.0)
MCH: 30.8 pg (ref 26.0–34.0)
MCHC: 31.4 g/dL (ref 30.0–36.0)
MCV: 98.2 fL (ref 80.0–100.0)
Platelets: 309 10*3/uL (ref 150–400)
RBC: 3.96 MIL/uL (ref 3.87–5.11)
RDW: 14.6 % (ref 11.5–15.5)
WBC: 9.2 10*3/uL (ref 4.0–10.5)
nRBC: 0 % (ref 0.0–0.2)

## 2019-06-16 LAB — TYPE AND SCREEN
ABO/RH(D): A NEG
Antibody Screen: NEGATIVE

## 2019-06-16 LAB — LIPASE, BLOOD: Lipase: 21 U/L (ref 11–51)

## 2019-06-16 LAB — TROPONIN I (HIGH SENSITIVITY)
Troponin I (High Sensitivity): 11 ng/L (ref <18)
Troponin I (High Sensitivity): 8 ng/L (ref <18)

## 2019-06-16 LAB — ABO/RH: ABO/RH(D): A NEG

## 2019-06-16 MED ORDER — NITROGLYCERIN 2 % TD OINT
1.0000 [in_us] | TOPICAL_OINTMENT | Freq: Once | TRANSDERMAL | Status: AC
  Administered 2019-06-16: 1 [in_us] via TOPICAL
  Filled 2019-06-16: qty 1

## 2019-06-16 MED ORDER — ONDANSETRON HCL 4 MG/2ML IJ SOLN
4.0000 mg | Freq: Once | INTRAMUSCULAR | Status: AC
  Administered 2019-06-16: 4 mg via INTRAVENOUS
  Filled 2019-06-16: qty 2

## 2019-06-16 NOTE — ED Notes (Signed)
Pt able to eat and drink with no endorsement of N/V

## 2019-06-16 NOTE — Discharge Instructions (Addendum)
You were seen today for chest pain.  Your pain is resolved by the time I saw you.  Your heart testing is reassuring.  Follow-up closely with your cardiologist.  You likely need stress testing if you have not had any recently.  If you develop recurrent symptoms or worsening symptoms you should be reevaluated.

## 2019-06-16 NOTE — ED Provider Notes (Signed)
[facility] EMERGENCY DEPARTMENT Provider Note   CSN: [id number] Arrival date & time: 06/15/19  2225     History Chief Complaint  Patient presents with  . Chest Pain  . Emesis    Kelsey Rivera is a 74 y.o. female.  HPI     This is a 74 year old female with a history of hypertension, hyperlipidemia, mitral regurgitation, chronic kidney disease, coronary artery disease who presents with onset of chest pain around 9 PM.  Patient reports that it was anterior nonradiating.  She states she was not exerting herself when symptoms started.  It goes across her entire chest.  She denies any associated shortness of breath, fever, cough.  She has had pain like this before in the past without diagnosis.  She states that she initially felt like it was her reflux.  She subsequently had several episodes of nonbilious, nonbloody emesis.  She was given aspirin by EMS.  Her pain now is 2 out of 10.  Has been constant since onset.  She does report chronic left greater than right lower extremity edema.  She reports an old blood clot.  She is not on blood thinners.  Patient self reports stress testing approximately 1 year ago that was reassuring.  Review of chart reveals cardiology visit recently which mentions coronary artery calcifications noted on prior CT scan.  Past Medical History:  Diagnosis Date  . Arthritis of foot, left 01/09/2016  . Baker's cyst of knee 02/06/2017  . Chest pain in adult 04/25/2015   Overview:  Lexiscan  MPS with normal perfusion and function, EF 52%  . Essential hypertension 04/26/2015  . Extensor tendonitis of foot 12/14/2015   Overview:  Left  . Hyperlipidemia 02/06/2017  . Injury of left foot 07/25/2015  . LBBB (left bundle branch block) 04/25/2015  . Migraine 02/06/2017  . Osteoarthritis 02/06/2017  . Osteoarthritis of left foot 11/14/2015  . Pathological fracture of metatarsal bone of left foot with routine healing 08/15/2015  . RLS (restless legs  syndrome) 04/26/2015    Patient Active Problem List   Diagnosis Date Noted  . Coronary artery calcification seen on CT scan 10/22/2018  . AKI (acute kidney injury) ([place]) 05/20/2018  . Bilateral lower extremity edema 05/20/2018  . Chronic diastolic (congestive) heart failure ([place]) 05/20/2018  . CKD (chronic kidney disease) stage 3, GFR 30-59 ml/min 05/20/2018  . Mitral regurgitation 12/17/2017  . Plantar fasciitis of left foot 06/03/2017  . Venous ulcer of left leg ([place]) 02/19/2017  . Hyperlipidemia 02/06/2017  . Migraine 02/06/2017  . Osteoarthritis 02/06/2017  . Baker's cyst of knee 02/06/2017  . Arthritis of foot, left 01/09/2016  . Extensor tendonitis of foot 12/14/2015  . Osteoarthritis of left foot 11/14/2015  . Pathological fracture of metatarsal bone of left foot with routine healing 08/15/2015  . Injury of left foot 07/25/2015  . Essential hypertension 04/26/2015  . RLS (restless legs syndrome) 04/26/2015  . Chest pain in adult 04/25/2015  . LBBB (left bundle branch block) 04/25/2015    Past Surgical History:  Procedure Laterality Date  . ABDOMINAL HYSTERECTOMY    . CHOLECYSTECTOMY    . FOOT FRACTURE SURGERY    . TONSILLECTOMY    . VEIN SURGERY       OB History   No obstetric history on file.     Family History  Problem Relation Age of Onset  . Heart attack Mother   . Heart attack Father   . Cancer Brother   . CAD  Brother   . Pulmonary embolism Brother     Social History   Tobacco Use  . Smoking status: Never Smoker  . Smokeless tobacco: Never Used  Substance Use Topics  . Alcohol use: No  . Drug use: No    Home Medications Prior to Admission medications   Medication Sig Start Date End Date Taking? Authorizing Provider  albuterol (VENTOLIN HFA) 108 (90 Base) MCG/ACT inhaler  05/28/19   [provider]  amLODipine (NORVASC) 10 MG tablet Take 10 mg by mouth daily. 03/15/18   [provider]  aspirin EC 81 MG tablet Take 1 tablet  (81 mg total) by mouth daily. 10/22/18   Revankar, Reita Cliche, MD  atorvastatin (LIPITOR) 20 MG tablet Take 1 tablet (20 mg total) by mouth daily. 12/30/18 05/31/19  Revankar, Reita Cliche, MD  Calcium Carbonate-Vitamin D 600-400 MG-UNIT tablet TAKE 2 (TWO) TABLET BY MOUTH DAILY FOR OSTEOPENIA 03/05/18   [provider]  diazepam (VALIUM) 10 MG tablet TAKE 1 TABLET BY MOUTH AT BEDTIME EVERY OTHER NIGHT FOR ANXIETY 11/22/16   [provider]  escitalopram (LEXAPRO) 5 MG tablet Take 5 mg by mouth daily. 05/07/18   [provider]  esomeprazole (NEXIUM) 20 MG capsule Take 20 mg by mouth daily.     [provider]  fluticasone (FLONASE) 50 MCG/ACT nasal spray Place 1 spray into both nostrils daily as needed. 02/27/18   [provider]  gabapentin (NEURONTIN) 300 MG capsule take 1 capsule by mouth at bedtime 05/23/15   [provider]  hydrALAZINE (APRESOLINE) 25 MG tablet TAKE 0.5 TABLETS (12.5 MG TOTAL) BY MOUTH 3 (THREE) TIMES DAILY. 05/28/19   Revankar, Reita Cliche, MD  hydrochlorothiazide (HYDRODIURIL) 25 MG tablet TAKE 1 TABLET BY MOUTH EVERY DAY 02/21/17   [provider]  HYDROcodone-acetaminophen (NORCO/VICODIN) 5-325 MG tablet TAKE 1 TO 2 TABLETS BY MOUTH EVERY 8 HOURS AS NEEDED 04/29/19   [provider]  hydrOXYzine (ATARAX/VISTARIL) 25 MG tablet Take 25 mg by mouth daily as needed.    [provider]  methocarbamol (ROBAXIN) 500 MG tablet TAKE 2 (TWO) TABLET THREE TIMES DAILY AS NEEDED FOR BACK SPASMS 07/12/16   [provider]  nitroGLYCERIN (NITROSTAT) 0.4 MG SL tablet PLACE 1 TABLET (0.4 MG TOTAL) UNDER THE TONGUE EVERY 5 (FIVE) MINUTES AS NEEDED. 02/19/19 05/20/19  Revankar, Reita Cliche, MD  rOPINIRole (REQUIP) 4 MG tablet Take 2 tablets by mouth daily. 03/03/15   [provider]  telmisartan (MICARDIS) 80 MG tablet Take 80 mg by mouth daily. 04/13/18   [provider]  zolpidem (AMBIEN) 5 MG tablet Take 5 mg by  mouth at bedtime as needed.  11/14/15   [provider]    Allergies    Sulfa antibiotics  Review of Systems   Review of Systems  Constitutional: Negative for fever.  Respiratory: Negative for cough and shortness of breath.   Cardiovascular: Positive for chest pain. Negative for leg swelling.  Gastrointestinal: Positive for nausea and vomiting. Negative for abdominal pain, constipation and diarrhea.  Genitourinary: Negative for dysuria.  All other systems reviewed and are negative.   Physical Exam Updated Vital Signs BP 125/60   Pulse 63   Temp (!) 97.5 F (36.4 C) (Oral)   Resp 17   SpO2 94%   Physical Exam Vitals and nursing note reviewed.  Constitutional:      Appearance: She is well-developed. She is obese.  HENT:     Head: Normocephalic and atraumatic.  Eyes:     Pupils: Pupils are equal, round, and reactive to light.  Cardiovascular:     Rate and Rhythm: Normal rate and regular rhythm.     Heart sounds: Murmur present.  Pulmonary:     Effort: Pulmonary effort is normal. No respiratory distress.     Breath sounds: No wheezing.  Abdominal:     General: Bowel sounds are normal.     Palpations: Abdomen is soft.  Musculoskeletal:     Cervical back: Neck supple.     Right lower leg: No tenderness. Edema present.     Left lower leg: No tenderness. Edema present.  Skin:    General: Skin is warm and dry.  Neurological:     Mental Status: She is alert and oriented to person, place, and time.  Psychiatric:        Mood and Affect: Mood normal.     ED Results / Procedures / Treatments   Labs (all labs ordered are listed, but only abnormal results are displayed) Labs Reviewed  COMPREHENSIVE METABOLIC PANEL - Abnormal; Notable for the following components:      Result Value   Glucose, Bld 130 (*)    Creatinine, Ser 1.28 (*)    GFR calc non Af Amer 41 (*)    GFR calc Af Amer 48 (*)    All other components within normal limits  CBC  LIPASE, BLOOD  TYPE  AND SCREEN  ABO/RH  TROPONIN I (HIGH SENSITIVITY)  TROPONIN I (HIGH SENSITIVITY)    EKG EKG Interpretation  Date/Time:  Tuesday June 15 2019 22:57:30 EST Ventricular Rate:  76 PR Interval:    QRS Duration: 145 QT Interval:  407 QTC Calculation: 458 R Axis:   36 Text Interpretation: Sinus rhythm Borderline prolonged PR interval Left bundle branch block Confirmed by Thayer Jew [phone]) on 06/15/2019 11:17:06 PM   Radiology DG Chest 2 View  Result Date: 06/15/2019 CLINICAL DATA:  Chest pain EXAM: CHEST - 2 VIEW COMPARISON:  06/05/2019 FINDINGS: Mild cardiomegaly. No focal airspace disease, pleural effusion, or pneumothorax. IMPRESSION: No active cardiopulmonary disease.  Mild cardiomegaly. Electronically Signed   By: Donavan Foil M.D.   On: 06/15/2019 23:30    Procedures Procedures (including critical care time)  Medications Ordered in ED Medications  ondansetron (ZOFRAN) injection 4 mg (4 mg Intravenous Given 06/16/19 0034)  nitroGLYCERIN (NITROGLYN) 2 % ointment 1 inch (1 inch Topical Given 06/16/19 0034)    ED Course  I have reviewed the triage vital signs and the nursing notes.  Pertinent labs & imaging results that were available during my care of the patient were reviewed by me and considered in my medical decision making (see chart for details).    MDM Rules/Calculators/A&P                       Patient presents with chest pain and vomiting.  Overall improved from onset of symptoms.  It was nonexertional.  She is overall nontoxic and vital signs are reassuring.  EKG shows persistent right bundle branch block.  There are some morphologic changes in 1 and aVL that are nondiagnostic and do not meet [place]'s criteria.  Initial troponin negative.  Chest x-ray shows no evidence of pneumothorax or pneumonia.  She does not appear overtly volume overloaded.  She also had some nausea and vomiting with some reflux symptoms which could be an anginal equivalent or  reflux.  Patient was given Zofran.  She had complete resolution of  her symptoms with Zofran.  She received aspirin by EMS.  Lab work notable for slight AKI.  Patient was given fluids.  Evidence of elevated lipase or LFTs.  No white count.  On multiple rechecks, patient reports that she is comfortable with no recurrent symptoms.  Repeat troponin is negative.  While her symptoms could be an anginal equivalent, her picture is somewhat mixed.  She has a heart score of 4.  I do not see any functional testing in her chart.  I discussed options with the patient including admission for formal chest pain rule out versus discharge and close follow-up with her primary cardiologist.  Patient would like to be discharged as she states she feels better.  She was given strict return precautions.  After history, exam, and medical workup I feel the patient has been appropriately medically screened and is safe for discharge home. Pertinent diagnoses were discussed with the patient. Patient was given return precautions.   Final Clinical Impression(s) / ED Diagnoses Final diagnoses:  Atypical chest pain  Non-intractable vomiting with nausea, unspecified vomiting type    Rx / DC Orders ED Discharge Orders    None       Nakeem Murnane, Barbette Hair, MD 06/16/19 [phone]

## 2019-06-16 NOTE — Progress Notes (Signed)
Can you call this patient and get her scheduled for a Lexiscan?

## 2019-06-17 ENCOUNTER — Telehealth: Payer: Self-pay | Admitting: *Deleted

## 2019-06-17 NOTE — Telephone Encounter (Signed)
-----   Message from [name], MD sent at 06/04/2019  8:18 AM EST ----- Old blood clot in the left leg.  No much concern at this time.  The results of the study is unremarkable. Please inform patient. I will discuss in detail at next appointment. Cc  primary care/referring physician [name], MD 06/04/2019 8:18 AM

## 2019-06-17 NOTE — Telephone Encounter (Signed)
Called to give patient results of doppler, she verbalized understanding. However she stated she went to the hospital last week for chest pains. The hospitalist requested she have a stress test done soon. Patient is scheduled for follow up visit with [facility] on 07/02/2019 but would like to have the stress test done sooner. Please advise and let patient know if this will be possible.

## 2019-06-18 NOTE — Telephone Encounter (Signed)
Please have the patient be scheduled for a Lexiscan stress test coming week at the office and if he cannot do it then we can schedule it at [facility] or in [place].

## 2019-06-21 DIAGNOSIS — M4004 Postural kyphosis, thoracic region: Secondary | ICD-10-CM | POA: Diagnosis not present

## 2019-06-21 DIAGNOSIS — M62521 Muscle wasting and atrophy, not elsewhere classified, right upper arm: Secondary | ICD-10-CM | POA: Diagnosis not present

## 2019-06-21 DIAGNOSIS — M542 Cervicalgia: Secondary | ICD-10-CM | POA: Diagnosis not present

## 2019-06-21 DIAGNOSIS — M62531 Muscle wasting and atrophy, not elsewhere classified, right forearm: Secondary | ICD-10-CM | POA: Diagnosis not present

## 2019-06-21 DIAGNOSIS — M25521 Pain in right elbow: Secondary | ICD-10-CM | POA: Diagnosis not present

## 2019-06-23 DIAGNOSIS — M25421 Effusion, right elbow: Secondary | ICD-10-CM | POA: Diagnosis not present

## 2019-06-23 DIAGNOSIS — S53104D Unspecified dislocation of right ulnohumeral joint, subsequent encounter: Secondary | ICD-10-CM | POA: Diagnosis not present

## 2019-06-23 DIAGNOSIS — S52121D Displaced fracture of head of right radius, subsequent encounter for closed fracture with routine healing: Secondary | ICD-10-CM | POA: Diagnosis not present

## 2019-06-24 ENCOUNTER — Other Ambulatory Visit: Payer: Self-pay | Admitting: Cardiology

## 2019-07-02 ENCOUNTER — Encounter (INDEPENDENT_AMBULATORY_CARE_PROVIDER_SITE_OTHER): Payer: Self-pay

## 2019-07-02 ENCOUNTER — Ambulatory Visit: Payer: Medicare Other | Admitting: Cardiology

## 2019-07-02 ENCOUNTER — Encounter: Payer: Self-pay | Admitting: Cardiology

## 2019-07-02 ENCOUNTER — Other Ambulatory Visit: Payer: Self-pay

## 2019-07-02 VITALS — BP 138/70 | HR 84 | Ht 60.0 in | Wt 177.0 lb

## 2019-07-02 DIAGNOSIS — I2510 Atherosclerotic heart disease of native coronary artery without angina pectoris: Secondary | ICD-10-CM | POA: Diagnosis not present

## 2019-07-02 DIAGNOSIS — E782 Mixed hyperlipidemia: Secondary | ICD-10-CM

## 2019-07-02 DIAGNOSIS — I10 Essential (primary) hypertension: Secondary | ICD-10-CM | POA: Diagnosis not present

## 2019-07-02 NOTE — Patient Instructions (Signed)
Medication Instructions:  No medication changes *If you need a refill on your cardiac medications before your next appointment, please call your pharmacy*   Lab Work: None ordered If you have labs (blood work) drawn today and your tests are completely normal, you will receive your results only by: Marland Kitchen MyChart Message (if you have MyChart) OR . A paper copy in the mail If you have any lab test that is abnormal or we need to change your treatment, we will call you to review the results.   Testing/Procedures: None ordered   Follow-Up: At [facility], you and your health needs are our priority.  As part of our continuing mission to provide you with exceptional heart care, we have created designated Provider Care Teams.  These Care Teams include your primary Cardiologist (physician) and Advanced Practice Providers (APPs -  Physician Assistants and Nurse Practitioners) who all work together to provide you with the care you need, when you need it.  We recommend signing up for the patient portal called "MyChart".  Sign up information is provided on this After Visit Summary.  MyChart is used to connect with patients for Virtual Visits (Telemedicine).  Patients are able to view lab/test results, encounter notes, upcoming appointments, etc.  Non-urgent messages can be sent to your provider as well.   To learn more about what you can do with MyChart, go to [email or web address].    Your next appointment:   3 week(s)  The format for your next appointment:   In Person  Provider:   Jyl Heinz, MD   Other Instructions NA

## 2019-07-02 NOTE — Progress Notes (Signed)
Cardiology Office Note:    Date:  07/02/2019   ID:  Kelsey Rivera, DOB [birth year], MRN [id number]  PCP:  Street, [place], MD  Cardiologist:  [name], MD   Referring MD: [address] [place] Mt, *    ASSESSMENT:    1. Essential hypertension   2. Mixed hyperlipidemia   3. Coronary artery calcification seen on CT scan    PLAN:    In order of problems listed above:  1. Primary prevention stressed with the patient.  Importance of compliance with diet and medication stressed and she vocalized understanding.  Diet was discussed for dyslipidemia and weight reduction was stressed and she promises to comply 2. Mixed dyslipidemia: Lipids followed by primary care physician and patient is on statin therapy. 3. Dyspnea on exertion: Patient awaiting stress testing which will be done soon in the next few days.  She overall appears comfortable with activities of daily living. 4. Patient will be seen in follow-up appointment in 3 weeks or earlier if the patient has any concerns    Medication Adjustments/Labs and Tests Ordered: Current medicines are reviewed at length with the patient today.  Concerns regarding medicines are outlined above.  No orders of the defined types were placed in this encounter.  No orders of the defined types were placed in this encounter.    Chief Complaint  Patient presents with  . Follow-up     History of Present Illness:    Kelsey Rivera is a 74 y.o. female.  Patient has past medical history of coronary calcifications seen on CT scan, essential hypertension and mixed dyslipidemia.  She denies any problems at this time and takes care of activities of daily living.  Her shortness of breath on exertion persist.  She had leg swelling and a superficial DVT was diagnosed and medical therapy was recommended.  Subsequently she has done fine.  She is awaiting for stress testing at her office which is going to be done in the next few days.  At the time of  my evaluation, the patient is alert awake oriented and in no distress. Past Medical History:  Diagnosis Date  . Arthritis of foot, left 01/09/2016  . Baker's cyst of knee 02/06/2017  . Chest pain in adult 04/25/2015   Overview:  Lexiscan  MPS with normal perfusion and function, EF 52%  . Essential hypertension 04/26/2015  . Extensor tendonitis of foot 12/14/2015   Overview:  Left  . Hyperlipidemia 02/06/2017  . Injury of left foot 07/25/2015  . LBBB (left bundle branch block) 04/25/2015  . Migraine 02/06/2017  . Osteoarthritis 02/06/2017  . Osteoarthritis of left foot 11/14/2015  . Pathological fracture of metatarsal bone of left foot with routine healing 08/15/2015  . RLS (restless legs syndrome) 04/26/2015    Past Surgical History:  Procedure Laterality Date  . ABDOMINAL HYSTERECTOMY    . CHOLECYSTECTOMY    . FOOT FRACTURE SURGERY    . TONSILLECTOMY    . VEIN SURGERY      Current Medications: Current Meds  Medication Sig  . albuterol (VENTOLIN HFA) 108 (90 Base) MCG/ACT inhaler   . amLODipine (NORVASC) 10 MG tablet Take 10 mg by mouth daily.  Marland Kitchen aspirin EC 81 MG tablet Take 1 tablet (81 mg total) by mouth daily.  Marland Kitchen atorvastatin (LIPITOR) 20 MG tablet TAKE 1 TABLET BY MOUTH EVERY DAY  . bumetanide (BUMEX) 1 MG tablet Take by mouth.  . Calcium Carbonate-Vitamin D 600-400 MG-UNIT tablet TAKE 2 (TWO) TABLET  BY MOUTH DAILY FOR OSTEOPENIA  . diazepam (VALIUM) 10 MG tablet TAKE 1 TABLET BY MOUTH AT BEDTIME EVERY OTHER NIGHT FOR ANXIETY  . escitalopram (LEXAPRO) 5 MG tablet Take 5 mg by mouth daily.  Marland Kitchen esomeprazole (NEXIUM) 20 MG capsule Take 20 mg by mouth daily.   . fluticasone (FLONASE) 50 MCG/ACT nasal spray Place 1 spray into both nostrils daily as needed.  . gabapentin (NEURONTIN) 300 MG capsule take 1 capsule by mouth at bedtime  . hydrALAZINE (APRESOLINE) 25 MG tablet TAKE 0.5 TABLETS (12.5 MG TOTAL) BY MOUTH 3 (THREE) TIMES DAILY.  . hydrochlorothiazide (HYDRODIURIL) 25 MG  tablet TAKE 1 TABLET BY MOUTH EVERY DAY  . HYDROcodone-acetaminophen (NORCO/VICODIN) 5-325 MG tablet TAKE 1 TO 2 TABLETS BY MOUTH EVERY 8 HOURS AS NEEDED  . hydrOXYzine (ATARAX/VISTARIL) 25 MG tablet Take 25 mg by mouth daily as needed.  . methocarbamol (ROBAXIN) 500 MG tablet TAKE 2 (TWO) TABLET THREE TIMES DAILY AS NEEDED FOR BACK SPASMS  . rOPINIRole (REQUIP) 4 MG tablet Take 2 tablets by mouth daily.  Marland Kitchen telmisartan (MICARDIS) 80 MG tablet Take 80 mg by mouth daily.  Marland Kitchen zolpidem (AMBIEN) 5 MG tablet Take 5 mg by mouth at bedtime as needed.      Allergies:   Sulfa antibiotics   Social History   Socioeconomic History  . Marital status: Married    Spouse name: Not on file  . Number of children: Not on file  . Years of education: Not on file  . Highest education level: Not on file  Occupational History  . Not on file  Tobacco Use  . Smoking status: Never Smoker  . Smokeless tobacco: Never Used  Substance and Sexual Activity  . Alcohol use: No  . Drug use: No  . Sexual activity: Not on file  Other Topics Concern  . Not on file  Social History Narrative  . Not on file   Social Determinants of Health   Financial Resource Strain:   . Difficulty of Paying Living Expenses: Not on file  Food Insecurity:   . Worried About Charity fundraiser in the Last Year: Not on file  . Ran Out of Food in the Last Year: Not on file  Transportation Needs:   . Lack of Transportation (Medical): Not on file  . Lack of Transportation (Non-Medical): Not on file  Physical Activity:   . Days of Exercise per Week: Not on file  . Minutes of Exercise per Session: Not on file  Stress:   . Feeling of Stress : Not on file  Social Connections:   . Frequency of Communication with Friends and Family: Not on file  . Frequency of Social Gatherings with Friends and Family: Not on file  . Attends Religious Services: Not on file  . Active Member of Clubs or Organizations: Not on file  . Attends Theatre manager Meetings: Not on file  . Marital Status: Not on file     Family History: The patient's family history includes CAD in her brother; Cancer in her brother; Heart attack in her father and mother; Pulmonary embolism in her brother.  ROS:   Please see the history of present illness.    All other systems reviewed and are negative.  EKGs/Labs/Other Studies Reviewed:    The following studies were reviewed today: Summary:  RIGHT:  - No evidence of deep vein thrombosis in the lower extremity. No indirect  evidence of obstruction proximal to the inguinal ligament.  -  No cystic structure found in the popliteal fossa.    LEFT:  - Findings consistent with chronic superficial vein thrombosis involving  the left great saphenous vein.  - A cystic structure is found in the popliteal fossa.  Chronic DVT L GSV.    *See table(s) above for measurements and observations.   Electronically signed by Shirlee More MD on 06/02/2019 at 12:38:37 PM.      Recent Labs: 11/20/2018: TSH 1.360 06/16/2019: ALT 12; BUN 21; Creatinine, Ser 1.28; Hemoglobin 12.2; Platelets 309; Potassium 3.9; Sodium 141  Recent Lipid Panel    Component Value Date/Time   CHOL 144 02/10/2019 0816   TRIG 54 02/10/2019 0816   HDL 70 02/10/2019 0816   CHOLHDL 2.1 02/10/2019 0816   LDLCALC 62 02/10/2019 0816    Physical Exam:    VS:  BP 138/70   Pulse 84   Ht 5' (1.524 m)   Wt 177 lb (80.3 kg)   SpO2 96%   BMI 34.57 kg/m     Wt Readings from Last 3 Encounters:  07/02/19 177 lb (80.3 kg)  05/31/19 175 lb (79.4 kg)  11/19/18 168 lb (76.2 kg)     GEN: Patient is in no acute distress HEENT: Normal NECK: No JVD; No carotid bruits LYMPHATICS: No lymphadenopathy CARDIAC: Hear sounds regular, 2/6 systolic murmur at the apex. RESPIRATORY:  Clear to auscultation without rales, wheezing or rhonchi  ABDOMEN: Soft, non-tender, non-distended MUSCULOSKELETAL:  No edema; No deformity  SKIN: Warm and dry  NEUROLOGIC:  Alert and oriented x 3 PSYCHIATRIC:  Normal affect   Signed, [name], MD  07/02/2019 3:27 PM    [place]

## 2019-07-13 ENCOUNTER — Telehealth: Payer: Self-pay | Admitting: *Deleted

## 2019-07-13 NOTE — Telephone Encounter (Signed)
Patient given detailed instructions per Myocardial Perfusion Study Information Sheet for the test on  07/20/19. Patient notified to arrive 15 minutes early and that it is imperative to arrive on time for appointment to keep from having the test rescheduled.  If you need to cancel or reschedule your appointment, please call the office within 24 hours of your appointment. . Patient verbalized understanding. Kirstie Peri

## 2019-07-14 DIAGNOSIS — M4004 Postural kyphosis, thoracic region: Secondary | ICD-10-CM | POA: Diagnosis not present

## 2019-07-14 DIAGNOSIS — M25521 Pain in right elbow: Secondary | ICD-10-CM | POA: Diagnosis not present

## 2019-07-14 DIAGNOSIS — M62521 Muscle wasting and atrophy, not elsewhere classified, right upper arm: Secondary | ICD-10-CM | POA: Diagnosis not present

## 2019-07-14 DIAGNOSIS — M62531 Muscle wasting and atrophy, not elsewhere classified, right forearm: Secondary | ICD-10-CM | POA: Diagnosis not present

## 2019-07-14 DIAGNOSIS — M542 Cervicalgia: Secondary | ICD-10-CM | POA: Diagnosis not present

## 2019-07-16 DIAGNOSIS — H04123 Dry eye syndrome of bilateral lacrimal glands: Secondary | ICD-10-CM | POA: Diagnosis not present

## 2019-07-20 ENCOUNTER — Other Ambulatory Visit: Payer: Self-pay

## 2019-07-20 ENCOUNTER — Ambulatory Visit (INDEPENDENT_AMBULATORY_CARE_PROVIDER_SITE_OTHER): Payer: Medicare Other

## 2019-07-20 DIAGNOSIS — I10 Essential (primary) hypertension: Secondary | ICD-10-CM

## 2019-07-20 DIAGNOSIS — I447 Left bundle-branch block, unspecified: Secondary | ICD-10-CM | POA: Diagnosis not present

## 2019-07-20 DIAGNOSIS — I2510 Atherosclerotic heart disease of native coronary artery without angina pectoris: Secondary | ICD-10-CM | POA: Diagnosis not present

## 2019-07-20 DIAGNOSIS — I340 Nonrheumatic mitral (valve) insufficiency: Secondary | ICD-10-CM

## 2019-07-20 LAB — MYOCARDIAL PERFUSION IMAGING
LV dias vol: 131 mL (ref 46–106)
LV sys vol: 48 mL
Peak HR: 85 {beats}/min
Rest HR: 63 {beats}/min
SDS: 3
SRS: 2
SSS: 5
TID: 1.14

## 2019-07-20 MED ORDER — TECHNETIUM TC 99M TETROFOSMIN IV KIT
10.1000 | PACK | Freq: Once | INTRAVENOUS | Status: AC | PRN
  Administered 2019-07-20: 10.1 via INTRAVENOUS

## 2019-07-20 MED ORDER — TECHNETIUM TC 99M TETROFOSMIN IV KIT
31.4000 | PACK | Freq: Once | INTRAVENOUS | Status: AC | PRN
  Administered 2019-07-20: 31.4 via INTRAVENOUS

## 2019-07-20 MED ORDER — REGADENOSON 0.4 MG/5ML IV SOLN
0.4000 mg | Freq: Once | INTRAVENOUS | Status: AC
  Administered 2019-07-20: 0.4 mg via INTRAVENOUS

## 2019-07-21 DIAGNOSIS — M62521 Muscle wasting and atrophy, not elsewhere classified, right upper arm: Secondary | ICD-10-CM | POA: Diagnosis not present

## 2019-07-21 DIAGNOSIS — M4004 Postural kyphosis, thoracic region: Secondary | ICD-10-CM | POA: Diagnosis not present

## 2019-07-21 DIAGNOSIS — M62531 Muscle wasting and atrophy, not elsewhere classified, right forearm: Secondary | ICD-10-CM | POA: Diagnosis not present

## 2019-07-21 DIAGNOSIS — M542 Cervicalgia: Secondary | ICD-10-CM | POA: Diagnosis not present

## 2019-07-21 DIAGNOSIS — M25521 Pain in right elbow: Secondary | ICD-10-CM | POA: Diagnosis not present

## 2019-07-22 ENCOUNTER — Ambulatory Visit: Payer: Medicare Other | Admitting: Cardiology

## 2019-07-22 ENCOUNTER — Encounter: Payer: Self-pay | Admitting: Cardiology

## 2019-07-22 ENCOUNTER — Other Ambulatory Visit: Payer: Self-pay

## 2019-07-22 VITALS — BP 126/62 | HR 78 | Ht 60.0 in | Wt 174.0 lb

## 2019-07-22 DIAGNOSIS — I2510 Atherosclerotic heart disease of native coronary artery without angina pectoris: Secondary | ICD-10-CM | POA: Diagnosis not present

## 2019-07-22 DIAGNOSIS — I10 Essential (primary) hypertension: Secondary | ICD-10-CM

## 2019-07-22 NOTE — Progress Notes (Signed)
Cardiology Office Note:    Date:  07/22/2019   ID:  Kelsey Rivera, DOB 06-11-1945, MRN [id number]  PCP:  Street, Sharon Mt, MD  Cardiologist:  [name], MD   Referring MD: [address] Sharon Mt, *    ASSESSMENT:    1. Coronary artery calcification seen on CT scan   2. Essential hypertension    PLAN:    In order of problems listed above:  1. Coronary artery disease: Secondary prevention stressed with the patient.  Importance of compliance with diet and medication stressed he vocalized understanding.  Diet and importance of exercise emphasized and she promises to do better. 2. Essential hypertension: Blood pressure is stable recent blood work was reviewed with her. 3. Mixed dyslipidemia: Patient is taking appropriate medications and is planning to do better with diet. 4. Patient will be seen in follow-up appointment in 6 months or earlier if the patient has any concerns    Medication Adjustments/Labs and Tests Ordered: Current medicines are reviewed at length with the patient today.  Concerns regarding medicines are outlined above.  No orders of the defined types were placed in this encounter.  No orders of the defined types were placed in this encounter.    Chief Complaint  Patient presents with  . Follow-up    3 Weeks      History of Present Illness:    Kelsey Rivera is a 74 y.o. female.  Patient has past medical history of coronary artery disease diagnosed by coronary calcification seen on CT scan.  She denies any problems at this time and takes care of activities of daily living.  No chest pain orthopnea or PND.  CT scan was unremarkable.  Her ejection fraction is preserved.  At the time of my evaluation, the patient is alert awake oriented and in no distress.  She leads a sedentary lifestyle  Past Medical History:  Diagnosis Date  . Arthritis of foot, left 01/09/2016  . Baker's cyst of knee 02/06/2017  . Chest pain in adult 04/25/2015   Overview:   Lexiscan  MPS with normal perfusion and function, EF 52%  . Essential hypertension 04/26/2015  . Extensor tendonitis of foot 12/14/2015   Overview:  Left  . Hyperlipidemia 02/06/2017  . Injury of left foot 07/25/2015  . LBBB (left bundle branch block) 04/25/2015  . Migraine 02/06/2017  . Osteoarthritis 02/06/2017  . Osteoarthritis of left foot 11/14/2015  . Pathological fracture of metatarsal bone of left foot with routine healing 08/15/2015  . RLS (restless legs syndrome) 04/26/2015    Past Surgical History:  Procedure Laterality Date  . ABDOMINAL HYSTERECTOMY    . CHOLECYSTECTOMY    . FOOT FRACTURE SURGERY    . TONSILLECTOMY    . VEIN SURGERY      Current Medications: Current Meds  Medication Sig  . albuterol (VENTOLIN HFA) 108 (90 Base) MCG/ACT inhaler   . amLODipine (NORVASC) 10 MG tablet Take 10 mg by mouth daily.  Marland Kitchen aspirin EC 81 MG tablet Take 1 tablet (81 mg total) by mouth daily.  Marland Kitchen atorvastatin (LIPITOR) 20 MG tablet TAKE 1 TABLET BY MOUTH EVERY DAY  . bumetanide (BUMEX) 1 MG tablet Take by mouth.  . Calcium Carb-Cholecalciferol (CALCIUM-VITAMIN D3) 600-400 MG-UNIT TABS Take 2 tablets by mouth daily.  . Calcium Carbonate-Vitamin D 600-400 MG-UNIT tablet TAKE 2 (TWO) TABLET BY MOUTH DAILY FOR OSTEOPENIA  . diazepam (VALIUM) 10 MG tablet TAKE 1 TABLET BY MOUTH AT BEDTIME EVERY OTHER NIGHT FOR ANXIETY  .  escitalopram (LEXAPRO) 5 MG tablet Take 5 mg by mouth daily.  Marland Kitchen esomeprazole (NEXIUM) 20 MG capsule Take 20 mg by mouth daily.   . fluticasone (FLONASE) 50 MCG/ACT nasal spray Place 1 spray into both nostrils daily as needed.  . gabapentin (NEURONTIN) 300 MG capsule take 1 capsule by mouth at bedtime  . hydrALAZINE (APRESOLINE) 25 MG tablet TAKE 0.5 TABLETS (12.5 MG TOTAL) BY MOUTH 3 (THREE) TIMES DAILY.  . hydrochlorothiazide (HYDRODIURIL) 25 MG tablet TAKE 1 TABLET BY MOUTH EVERY DAY  . HYDROcodone-acetaminophen (NORCO/VICODIN) 5-325 MG tablet TAKE 1 TO 2 TABLETS BY  MOUTH EVERY 8 HOURS AS NEEDED  . hydrOXYzine (ATARAX/VISTARIL) 25 MG tablet Take 25 mg by mouth daily as needed.  . methocarbamol (ROBAXIN) 500 MG tablet TAKE 2 (TWO) TABLET THREE TIMES DAILY AS NEEDED FOR BACK SPASMS  . rOPINIRole (REQUIP) 4 MG tablet Take 2 tablets by mouth daily.  Marland Kitchen telmisartan (MICARDIS) 80 MG tablet Take 80 mg by mouth daily.  Marland Kitchen zolpidem (AMBIEN) 5 MG tablet Take 5 mg by mouth at bedtime as needed.      Allergies:   Sulfa antibiotics   Social History   Socioeconomic History  . Marital status: Married    Spouse name: Not on file  . Number of children: Not on file  . Years of education: Not on file  . Highest education level: Not on file  Occupational History  . Not on file  Tobacco Use  . Smoking status: Never Smoker  . Smokeless tobacco: Never Used  Substance and Sexual Activity  . Alcohol use: No  . Drug use: No  . Sexual activity: Not on file  Other Topics Concern  . Not on file  Social History Narrative  . Not on file   Social Determinants of Health   Financial Resource Strain:   . Difficulty of Paying Living Expenses:   Food Insecurity:   . Worried About Charity fundraiser in the Last Year:   . Arboriculturist in the Last Year:   Transportation Needs:   . Film/video editor (Medical):   Marland Kitchen Lack of Transportation (Non-Medical):   Physical Activity:   . Days of Exercise per Week:   . Minutes of Exercise per Session:   Stress:   . Feeling of Stress :   Social Connections:   . Frequency of Communication with Friends and Family:   . Frequency of Social Gatherings with Friends and Family:   . Attends Religious Services:   . Active Member of Clubs or Organizations:   . Attends Archivist Meetings:   Marland Kitchen Marital Status:      Family History: The patient's family history includes CAD in her brother; Cancer in her brother; Heart attack in her father and mother; Pulmonary embolism in her brother.  ROS:   Please see the history of  present illness.    All other systems reviewed and are negative.  EKGs/Labs/Other Studies Reviewed:    The following studies were reviewed today: Study Highlights   Nuclear stress EF: 63%.  The left ventricular ejection fraction is normal (55-65%).  There was no ST segment deviation noted during stress.  No T wave inversion was noted during stress.  Defect 1: There is a small defect of mild severity present in the apical anterior and apex location.  The study is normal.  This is a low risk study.      Recent Labs: 11/20/2018: TSH 1.360 06/16/2019: ALT 12; BUN 21;  Creatinine, Ser 1.28; Hemoglobin 12.2; Platelets 309; Potassium 3.9; Sodium 141  Recent Lipid Panel    Component Value Date/Time   CHOL 144 02/10/2019 0816   TRIG 54 02/10/2019 0816   HDL 70 02/10/2019 0816   CHOLHDL 2.1 02/10/2019 0816   LDLCALC 62 02/10/2019 0816    Physical Exam:    VS:  BP 126/62   Pulse 78   Ht 5' (1.524 m)   Wt 174 lb (78.9 kg)   SpO2 95%   BMI 33.98 kg/m     Wt Readings from Last 3 Encounters:  07/22/19 174 lb (78.9 kg)  07/20/19 175 lb (79.4 kg)  07/02/19 177 lb (80.3 kg)     GEN: Patient is in no acute distress HEENT: Normal NECK: No JVD; No carotid bruits LYMPHATICS: No lymphadenopathy CARDIAC: Hear sounds regular, 2/6 systolic murmur at the apex. RESPIRATORY:  Clear to auscultation without rales, wheezing or rhonchi  ABDOMEN: Soft, non-tender, non-distended MUSCULOSKELETAL:  No edema; No deformity  SKIN: Warm and dry NEUROLOGIC:  Alert and oriented x 3 PSYCHIATRIC:  Normal affect   Signed, [name], MD  07/22/2019 11:05 AM    [place]

## 2019-07-22 NOTE — Patient Instructions (Signed)
Medication Instructions:  No medication changes *If you need a refill on your cardiac medications before your next appointment, please call your pharmacy*   Lab Work: None ordered If you have labs (blood work) drawn today and your tests are completely normal, you will receive your results only by: Marland Kitchen MyChart Message (if you have MyChart) OR . A paper copy in the mail If you have any lab test that is abnormal or we need to change your treatment, we will call you to review the results.   Testing/Procedures: None ordered   Follow-Up: At [facility], you and your health needs are our priority.  As part of our continuing mission to provide you with exceptional heart care, we have created designated Provider Care Teams.  These Care Teams include your primary Cardiologist (physician) and Advanced Practice Providers (APPs -  Physician Assistants and Nurse Practitioners) who all work together to provide you with the care you need, when you need it.  We recommend signing up for the patient portal called "MyChart".  Sign up information is provided on this After Visit Summary.  MyChart is used to connect with patients for Virtual Visits (Telemedicine).  Patients are able to view lab/test results, encounter notes, upcoming appointments, etc.  Non-urgent messages can be sent to your provider as well.   To learn more about what you can do with MyChart, go to [email or web address].    Your next appointment:   6 month(s)  The format for your next appointment:   In Person  Provider:   Jyl Heinz, MD   Other Instructions Have a great day!

## 2019-07-28 DIAGNOSIS — M62531 Muscle wasting and atrophy, not elsewhere classified, right forearm: Secondary | ICD-10-CM | POA: Diagnosis not present

## 2019-07-28 DIAGNOSIS — M542 Cervicalgia: Secondary | ICD-10-CM | POA: Diagnosis not present

## 2019-07-28 DIAGNOSIS — M62521 Muscle wasting and atrophy, not elsewhere classified, right upper arm: Secondary | ICD-10-CM | POA: Diagnosis not present

## 2019-07-28 DIAGNOSIS — M25521 Pain in right elbow: Secondary | ICD-10-CM | POA: Diagnosis not present

## 2019-07-28 DIAGNOSIS — M4004 Postural kyphosis, thoracic region: Secondary | ICD-10-CM | POA: Diagnosis not present

## 2019-08-12 DIAGNOSIS — J40 Bronchitis, not specified as acute or chronic: Secondary | ICD-10-CM | POA: Diagnosis not present

## 2019-08-28 ENCOUNTER — Other Ambulatory Visit: Payer: Self-pay | Admitting: Cardiology

## 2019-08-30 DIAGNOSIS — J329 Chronic sinusitis, unspecified: Secondary | ICD-10-CM | POA: Diagnosis not present

## 2019-08-30 DIAGNOSIS — J40 Bronchitis, not specified as acute or chronic: Secondary | ICD-10-CM | POA: Diagnosis not present

## 2019-09-20 DIAGNOSIS — Z96661 Presence of right artificial ankle joint: Secondary | ICD-10-CM | POA: Diagnosis not present

## 2019-09-20 DIAGNOSIS — S53104D Unspecified dislocation of right ulnohumeral joint, subsequent encounter: Secondary | ICD-10-CM | POA: Diagnosis not present

## 2019-09-20 DIAGNOSIS — T8484XA Pain due to internal orthopedic prosthetic devices, implants and grafts, initial encounter: Secondary | ICD-10-CM | POA: Insufficient documentation

## 2019-09-20 DIAGNOSIS — S52121D Displaced fracture of head of right radius, subsequent encounter for closed fracture with routine healing: Secondary | ICD-10-CM | POA: Diagnosis not present

## 2019-09-20 HISTORY — DX: Pain due to internal orthopedic prosthetic devices, implants and grafts, initial encounter: T84.84XA

## 2019-09-28 DIAGNOSIS — B372 Candidiasis of skin and nail: Secondary | ICD-10-CM | POA: Diagnosis not present

## 2019-09-28 DIAGNOSIS — M25531 Pain in right wrist: Secondary | ICD-10-CM | POA: Diagnosis not present

## 2019-09-28 DIAGNOSIS — M25431 Effusion, right wrist: Secondary | ICD-10-CM | POA: Diagnosis not present

## 2019-10-14 DIAGNOSIS — Z4789 Encounter for other orthopedic aftercare: Secondary | ICD-10-CM | POA: Diagnosis not present

## 2019-10-14 DIAGNOSIS — I340 Nonrheumatic mitral (valve) insufficiency: Secondary | ICD-10-CM | POA: Diagnosis not present

## 2019-10-14 DIAGNOSIS — S53104D Unspecified dislocation of right ulnohumeral joint, subsequent encounter: Secondary | ICD-10-CM | POA: Diagnosis not present

## 2019-10-14 DIAGNOSIS — Z472 Encounter for removal of internal fixation device: Secondary | ICD-10-CM | POA: Diagnosis not present

## 2019-10-14 DIAGNOSIS — I10 Essential (primary) hypertension: Secondary | ICD-10-CM | POA: Diagnosis not present

## 2019-10-14 DIAGNOSIS — S52121D Displaced fracture of head of right radius, subsequent encounter for closed fracture with routine healing: Secondary | ICD-10-CM | POA: Diagnosis not present

## 2019-10-14 DIAGNOSIS — R600 Localized edema: Secondary | ICD-10-CM | POA: Diagnosis not present

## 2019-10-14 DIAGNOSIS — T8484XA Pain due to internal orthopedic prosthetic devices, implants and grafts, initial encounter: Secondary | ICD-10-CM | POA: Diagnosis not present

## 2019-11-04 DIAGNOSIS — Z471 Aftercare following joint replacement surgery: Secondary | ICD-10-CM | POA: Diagnosis not present

## 2019-11-04 DIAGNOSIS — Z882 Allergy status to sulfonamides status: Secondary | ICD-10-CM | POA: Diagnosis not present

## 2019-11-04 DIAGNOSIS — T8484XA Pain due to internal orthopedic prosthetic devices, implants and grafts, initial encounter: Secondary | ICD-10-CM | POA: Diagnosis not present

## 2019-11-04 DIAGNOSIS — Z9889 Other specified postprocedural states: Secondary | ICD-10-CM

## 2019-11-04 HISTORY — DX: Other specified postprocedural states: Z98.890

## 2019-12-06 DIAGNOSIS — I110 Hypertensive heart disease with heart failure: Secondary | ICD-10-CM | POA: Diagnosis not present

## 2019-12-06 DIAGNOSIS — I5032 Chronic diastolic (congestive) heart failure: Secondary | ICD-10-CM | POA: Diagnosis not present

## 2019-12-06 DIAGNOSIS — G2581 Restless legs syndrome: Secondary | ICD-10-CM | POA: Diagnosis not present

## 2019-12-06 DIAGNOSIS — M654 Radial styloid tenosynovitis [de Quervain]: Secondary | ICD-10-CM | POA: Diagnosis not present

## 2019-12-06 DIAGNOSIS — I5030 Unspecified diastolic (congestive) heart failure: Secondary | ICD-10-CM | POA: Diagnosis not present

## 2019-12-08 ENCOUNTER — Other Ambulatory Visit: Payer: Self-pay | Admitting: Cardiology

## 2019-12-24 DIAGNOSIS — M654 Radial styloid tenosynovitis [de Quervain]: Secondary | ICD-10-CM | POA: Diagnosis not present

## 2020-01-30 DIAGNOSIS — R300 Dysuria: Secondary | ICD-10-CM | POA: Diagnosis not present

## 2020-01-30 DIAGNOSIS — N10 Acute tubulo-interstitial nephritis: Secondary | ICD-10-CM | POA: Diagnosis not present

## 2020-01-30 DIAGNOSIS — N3000 Acute cystitis without hematuria: Secondary | ICD-10-CM | POA: Diagnosis not present

## 2020-02-01 DIAGNOSIS — H26493 Other secondary cataract, bilateral: Secondary | ICD-10-CM | POA: Diagnosis not present

## 2020-02-05 DIAGNOSIS — H26493 Other secondary cataract, bilateral: Secondary | ICD-10-CM | POA: Diagnosis not present

## 2020-02-07 DIAGNOSIS — R609 Edema, unspecified: Secondary | ICD-10-CM | POA: Diagnosis not present

## 2020-02-07 DIAGNOSIS — M1990 Unspecified osteoarthritis, unspecified site: Secondary | ICD-10-CM | POA: Diagnosis not present

## 2020-02-07 DIAGNOSIS — M79671 Pain in right foot: Secondary | ICD-10-CM | POA: Diagnosis not present

## 2020-02-22 ENCOUNTER — Other Ambulatory Visit: Payer: Self-pay | Admitting: Cardiology

## 2020-02-22 DIAGNOSIS — M722 Plantar fascial fibromatosis: Secondary | ICD-10-CM | POA: Diagnosis not present

## 2020-02-22 DIAGNOSIS — R0600 Dyspnea, unspecified: Secondary | ICD-10-CM | POA: Diagnosis not present

## 2020-02-22 DIAGNOSIS — R109 Unspecified abdominal pain: Secondary | ICD-10-CM | POA: Diagnosis not present

## 2020-02-23 DIAGNOSIS — M19071 Primary osteoarthritis, right ankle and foot: Secondary | ICD-10-CM

## 2020-02-23 DIAGNOSIS — I2510 Atherosclerotic heart disease of native coronary artery without angina pectoris: Secondary | ICD-10-CM | POA: Diagnosis not present

## 2020-02-23 DIAGNOSIS — M19271 Secondary osteoarthritis, right ankle and foot: Secondary | ICD-10-CM | POA: Diagnosis not present

## 2020-02-23 DIAGNOSIS — M19272 Secondary osteoarthritis, left ankle and foot: Secondary | ICD-10-CM | POA: Diagnosis not present

## 2020-02-23 DIAGNOSIS — R918 Other nonspecific abnormal finding of lung field: Secondary | ICD-10-CM | POA: Diagnosis not present

## 2020-02-23 DIAGNOSIS — J439 Emphysema, unspecified: Secondary | ICD-10-CM | POA: Diagnosis not present

## 2020-02-23 DIAGNOSIS — I517 Cardiomegaly: Secondary | ICD-10-CM | POA: Diagnosis not present

## 2020-02-23 HISTORY — DX: Primary osteoarthritis, right ankle and foot: M19.071

## 2020-03-02 DIAGNOSIS — N1832 Chronic kidney disease, stage 3b: Secondary | ICD-10-CM | POA: Diagnosis not present

## 2020-03-02 DIAGNOSIS — N390 Urinary tract infection, site not specified: Secondary | ICD-10-CM | POA: Diagnosis not present

## 2020-03-02 DIAGNOSIS — I70213 Atherosclerosis of native arteries of extremities with intermittent claudication, bilateral legs: Secondary | ICD-10-CM | POA: Diagnosis not present

## 2020-03-02 DIAGNOSIS — I2510 Atherosclerotic heart disease of native coronary artery without angina pectoris: Secondary | ICD-10-CM | POA: Diagnosis not present

## 2020-03-02 DIAGNOSIS — J439 Emphysema, unspecified: Secondary | ICD-10-CM | POA: Diagnosis not present

## 2020-03-13 DIAGNOSIS — R001 Bradycardia, unspecified: Secondary | ICD-10-CM | POA: Diagnosis not present

## 2020-03-13 DIAGNOSIS — E785 Hyperlipidemia, unspecified: Secondary | ICD-10-CM | POA: Diagnosis not present

## 2020-03-13 DIAGNOSIS — F419 Anxiety disorder, unspecified: Secondary | ICD-10-CM | POA: Diagnosis not present

## 2020-03-13 DIAGNOSIS — I5033 Acute on chronic diastolic (congestive) heart failure: Secondary | ICD-10-CM | POA: Diagnosis not present

## 2020-03-13 DIAGNOSIS — M1990 Unspecified osteoarthritis, unspecified site: Secondary | ICD-10-CM | POA: Diagnosis not present

## 2020-03-13 DIAGNOSIS — I429 Cardiomyopathy, unspecified: Secondary | ICD-10-CM | POA: Diagnosis not present

## 2020-03-13 DIAGNOSIS — Z85828 Personal history of other malignant neoplasm of skin: Secondary | ICD-10-CM | POA: Diagnosis not present

## 2020-03-13 DIAGNOSIS — I5042 Chronic combined systolic (congestive) and diastolic (congestive) heart failure: Secondary | ICD-10-CM | POA: Diagnosis not present

## 2020-03-13 DIAGNOSIS — I5022 Chronic systolic (congestive) heart failure: Secondary | ICD-10-CM | POA: Diagnosis not present

## 2020-03-13 DIAGNOSIS — Z79899 Other long term (current) drug therapy: Secondary | ICD-10-CM | POA: Diagnosis not present

## 2020-03-13 DIAGNOSIS — I340 Nonrheumatic mitral (valve) insufficiency: Secondary | ICD-10-CM | POA: Diagnosis not present

## 2020-03-13 DIAGNOSIS — I110 Hypertensive heart disease with heart failure: Secondary | ICD-10-CM | POA: Diagnosis not present

## 2020-03-13 DIAGNOSIS — E876 Hypokalemia: Secondary | ICD-10-CM | POA: Diagnosis not present

## 2020-03-13 DIAGNOSIS — Z9049 Acquired absence of other specified parts of digestive tract: Secondary | ICD-10-CM | POA: Diagnosis not present

## 2020-03-13 DIAGNOSIS — R079 Chest pain, unspecified: Secondary | ICD-10-CM | POA: Diagnosis not present

## 2020-03-13 DIAGNOSIS — I509 Heart failure, unspecified: Secondary | ICD-10-CM | POA: Diagnosis not present

## 2020-03-13 DIAGNOSIS — I2510 Atherosclerotic heart disease of native coronary artery without angina pectoris: Secondary | ICD-10-CM | POA: Diagnosis not present

## 2020-03-13 DIAGNOSIS — R0789 Other chest pain: Secondary | ICD-10-CM | POA: Diagnosis present

## 2020-03-13 DIAGNOSIS — Z7982 Long term (current) use of aspirin: Secondary | ICD-10-CM | POA: Diagnosis not present

## 2020-03-13 DIAGNOSIS — F32A Depression, unspecified: Secondary | ICD-10-CM | POA: Diagnosis not present

## 2020-03-13 DIAGNOSIS — Z882 Allergy status to sulfonamides status: Secondary | ICD-10-CM | POA: Diagnosis not present

## 2020-03-13 DIAGNOSIS — N183 Chronic kidney disease, stage 3 unspecified: Secondary | ICD-10-CM | POA: Diagnosis not present

## 2020-03-13 DIAGNOSIS — I2583 Coronary atherosclerosis due to lipid rich plaque: Secondary | ICD-10-CM | POA: Diagnosis not present

## 2020-03-13 DIAGNOSIS — R0602 Shortness of breath: Secondary | ICD-10-CM | POA: Diagnosis not present

## 2020-03-13 DIAGNOSIS — G44209 Tension-type headache, unspecified, not intractable: Secondary | ICD-10-CM | POA: Diagnosis not present

## 2020-03-13 DIAGNOSIS — I130 Hypertensive heart and chronic kidney disease with heart failure and stage 1 through stage 4 chronic kidney disease, or unspecified chronic kidney disease: Secondary | ICD-10-CM | POA: Diagnosis not present

## 2020-03-13 DIAGNOSIS — I442 Atrioventricular block, complete: Secondary | ICD-10-CM | POA: Diagnosis not present

## 2020-03-13 DIAGNOSIS — Z90710 Acquired absence of both cervix and uterus: Secondary | ICD-10-CM | POA: Diagnosis not present

## 2020-03-13 DIAGNOSIS — G2581 Restless legs syndrome: Secondary | ICD-10-CM | POA: Diagnosis not present

## 2020-03-13 DIAGNOSIS — K219 Gastro-esophageal reflux disease without esophagitis: Secondary | ICD-10-CM | POA: Diagnosis not present

## 2020-03-13 DIAGNOSIS — J449 Chronic obstructive pulmonary disease, unspecified: Secondary | ICD-10-CM | POA: Diagnosis not present

## 2020-03-13 DIAGNOSIS — I517 Cardiomegaly: Secondary | ICD-10-CM | POA: Diagnosis not present

## 2020-03-13 DIAGNOSIS — I10 Essential (primary) hypertension: Secondary | ICD-10-CM | POA: Diagnosis not present

## 2020-03-13 DIAGNOSIS — I248 Other forms of acute ischemic heart disease: Secondary | ICD-10-CM | POA: Diagnosis not present

## 2020-03-13 DIAGNOSIS — Z23 Encounter for immunization: Secondary | ICD-10-CM | POA: Diagnosis not present

## 2020-03-13 DIAGNOSIS — R519 Headache, unspecified: Secondary | ICD-10-CM | POA: Diagnosis not present

## 2020-03-13 DIAGNOSIS — I447 Left bundle-branch block, unspecified: Secondary | ICD-10-CM | POA: Diagnosis not present

## 2020-03-14 DIAGNOSIS — I340 Nonrheumatic mitral (valve) insufficiency: Secondary | ICD-10-CM

## 2020-03-14 DIAGNOSIS — I429 Cardiomyopathy, unspecified: Secondary | ICD-10-CM

## 2020-03-14 DIAGNOSIS — I2510 Atherosclerotic heart disease of native coronary artery without angina pectoris: Secondary | ICD-10-CM

## 2020-03-14 DIAGNOSIS — I10 Essential (primary) hypertension: Secondary | ICD-10-CM

## 2020-03-14 DIAGNOSIS — R0602 Shortness of breath: Secondary | ICD-10-CM

## 2020-03-15 ENCOUNTER — Inpatient Hospital Stay (HOSPITAL_COMMUNITY)
Admission: AD | Disposition: A | Discharge status: Home / Self Care | Payer: Self-pay | Source: Other Acute Inpatient Hospital | Attending: Interventional Cardiology

## 2020-03-15 ENCOUNTER — Inpatient Hospital Stay (HOSPITAL_COMMUNITY)
Admission: AD | Admit: 2020-03-15 | Discharge: 2020-03-17 | DRG: 287 | Disposition: A | Discharge status: Home / Self Care | Payer: Medicare Other | Source: Other Acute Inpatient Hospital | Attending: Interventional Cardiology | Admitting: Interventional Cardiology

## 2020-03-15 ENCOUNTER — Encounter (HOSPITAL_COMMUNITY): Payer: Self-pay | Admitting: Interventional Cardiology

## 2020-03-15 ENCOUNTER — Other Ambulatory Visit: Payer: Self-pay

## 2020-03-15 DIAGNOSIS — Z79899 Other long term (current) drug therapy: Secondary | ICD-10-CM | POA: Diagnosis not present

## 2020-03-15 DIAGNOSIS — I5022 Chronic systolic (congestive) heart failure: Secondary | ICD-10-CM

## 2020-03-15 DIAGNOSIS — Z90710 Acquired absence of both cervix and uterus: Secondary | ICD-10-CM

## 2020-03-15 DIAGNOSIS — E785 Hyperlipidemia, unspecified: Secondary | ICD-10-CM | POA: Diagnosis not present

## 2020-03-15 DIAGNOSIS — I130 Hypertensive heart and chronic kidney disease with heart failure and stage 1 through stage 4 chronic kidney disease, or unspecified chronic kidney disease: Secondary | ICD-10-CM | POA: Diagnosis not present

## 2020-03-15 DIAGNOSIS — F419 Anxiety disorder, unspecified: Secondary | ICD-10-CM | POA: Diagnosis present

## 2020-03-15 DIAGNOSIS — I5032 Chronic diastolic (congestive) heart failure: Secondary | ICD-10-CM | POA: Diagnosis present

## 2020-03-15 DIAGNOSIS — F32A Depression, unspecified: Secondary | ICD-10-CM | POA: Diagnosis not present

## 2020-03-15 DIAGNOSIS — R001 Bradycardia, unspecified: Secondary | ICD-10-CM | POA: Diagnosis present

## 2020-03-15 DIAGNOSIS — I5042 Chronic combined systolic (congestive) and diastolic (congestive) heart failure: Secondary | ICD-10-CM | POA: Diagnosis not present

## 2020-03-15 DIAGNOSIS — I340 Nonrheumatic mitral (valve) insufficiency: Secondary | ICD-10-CM | POA: Diagnosis not present

## 2020-03-15 DIAGNOSIS — Z23 Encounter for immunization: Secondary | ICD-10-CM | POA: Diagnosis present

## 2020-03-15 DIAGNOSIS — I442 Atrioventricular block, complete: Secondary | ICD-10-CM | POA: Diagnosis not present

## 2020-03-15 DIAGNOSIS — M1990 Unspecified osteoarthritis, unspecified site: Secondary | ICD-10-CM | POA: Diagnosis not present

## 2020-03-15 DIAGNOSIS — I429 Cardiomyopathy, unspecified: Secondary | ICD-10-CM | POA: Diagnosis not present

## 2020-03-15 DIAGNOSIS — N183 Chronic kidney disease, stage 3 unspecified: Secondary | ICD-10-CM | POA: Diagnosis present

## 2020-03-15 DIAGNOSIS — R0602 Shortness of breath: Secondary | ICD-10-CM | POA: Diagnosis not present

## 2020-03-15 DIAGNOSIS — Z9049 Acquired absence of other specified parts of digestive tract: Secondary | ICD-10-CM

## 2020-03-15 DIAGNOSIS — I447 Left bundle-branch block, unspecified: Secondary | ICD-10-CM | POA: Diagnosis present

## 2020-03-15 DIAGNOSIS — Z7982 Long term (current) use of aspirin: Secondary | ICD-10-CM

## 2020-03-15 DIAGNOSIS — I2583 Coronary atherosclerosis due to lipid rich plaque: Secondary | ICD-10-CM | POA: Diagnosis present

## 2020-03-15 DIAGNOSIS — G2581 Restless legs syndrome: Secondary | ICD-10-CM | POA: Diagnosis present

## 2020-03-15 DIAGNOSIS — I2510 Atherosclerotic heart disease of native coronary artery without angina pectoris: Secondary | ICD-10-CM | POA: Diagnosis present

## 2020-03-15 DIAGNOSIS — Z882 Allergy status to sulfonamides status: Secondary | ICD-10-CM | POA: Diagnosis not present

## 2020-03-15 DIAGNOSIS — I10 Essential (primary) hypertension: Secondary | ICD-10-CM

## 2020-03-15 DIAGNOSIS — K219 Gastro-esophageal reflux disease without esophagitis: Secondary | ICD-10-CM | POA: Diagnosis present

## 2020-03-15 DIAGNOSIS — R0789 Other chest pain: Secondary | ICD-10-CM | POA: Diagnosis present

## 2020-03-15 HISTORY — PX: RIGHT/LEFT HEART CATH AND CORONARY ANGIOGRAPHY: CATH118266

## 2020-03-15 HISTORY — DX: Bradycardia, unspecified: R00.1

## 2020-03-15 LAB — CBC
HCT: 41.2 % (ref 36.0–46.0)
Hemoglobin: 12.8 g/dL (ref 12.0–15.0)
MCH: 29.7 pg (ref 26.0–34.0)
MCHC: 31.1 g/dL (ref 30.0–36.0)
MCV: 95.6 fL (ref 80.0–100.0)
Platelets: 336 10*3/uL (ref 150–400)
RBC: 4.31 MIL/uL (ref 3.87–5.11)
RDW: 14.2 % (ref 11.5–15.5)
WBC: 11.1 10*3/uL — ABNORMAL HIGH (ref 4.0–10.5)
nRBC: 0 % (ref 0.0–0.2)

## 2020-03-15 LAB — POCT I-STAT EG7
Acid-Base Excess: 11 mmol/L — ABNORMAL HIGH (ref 0.0–2.0)
Acid-Base Excess: 12 mmol/L — ABNORMAL HIGH (ref 0.0–2.0)
Bicarbonate: 38.7 mmol/L — ABNORMAL HIGH (ref 20.0–28.0)
Bicarbonate: 39.6 mmol/L — ABNORMAL HIGH (ref 20.0–28.0)
Calcium, Ion: 1.31 mmol/L (ref 1.15–1.40)
Calcium, Ion: 1.37 mmol/L (ref 1.15–1.40)
HCT: 38 % (ref 36.0–46.0)
HCT: 39 % (ref 36.0–46.0)
Hemoglobin: 12.9 g/dL (ref 12.0–15.0)
Hemoglobin: 13.3 g/dL (ref 12.0–15.0)
O2 Saturation: 70 %
O2 Saturation: 72 %
Potassium: 5.3 mmol/L — ABNORMAL HIGH (ref 3.5–5.1)
Potassium: 5.5 mmol/L — ABNORMAL HIGH (ref 3.5–5.1)
Sodium: 138 mmol/L (ref 135–145)
Sodium: 139 mmol/L (ref 135–145)
TCO2: 41 mmol/L — ABNORMAL HIGH (ref 22–32)
TCO2: 42 mmol/L — ABNORMAL HIGH (ref 22–32)
pCO2, Ven: 65.1 mmHg — ABNORMAL HIGH (ref 44.0–60.0)
pCO2, Ven: 65.2 mmHg — ABNORMAL HIGH (ref 44.0–60.0)
pH, Ven: 7.381 (ref 7.250–7.430)
pH, Ven: 7.392 (ref 7.250–7.430)
pO2, Ven: 39 mmHg (ref 32.0–45.0)
pO2, Ven: 40 mmHg (ref 32.0–45.0)

## 2020-03-15 LAB — CREATININE, SERUM
Creatinine, Ser: 1.26 mg/dL — ABNORMAL HIGH (ref 0.44–1.00)
GFR, Estimated: 45 mL/min — ABNORMAL LOW (ref >60)

## 2020-03-15 LAB — POCT I-STAT 7, (LYTES, BLD GAS, ICA,H+H)
Acid-Base Excess: 12 mmol/L — ABNORMAL HIGH (ref 0.0–2.0)
Bicarbonate: 38.7 mmol/L — ABNORMAL HIGH (ref 20.0–28.0)
Calcium, Ion: 1.3 mmol/L (ref 1.15–1.40)
HCT: 38 % (ref 36.0–46.0)
Hemoglobin: 12.9 g/dL (ref 12.0–15.0)
O2 Saturation: 100 %
Potassium: 5 mmol/L (ref 3.5–5.1)
Sodium: 140 mmol/L (ref 135–145)
TCO2: 41 mmol/L — ABNORMAL HIGH (ref 22–32)
pCO2 arterial: 61.9 mmHg — ABNORMAL HIGH (ref 32.0–48.0)
pH, Arterial: 7.405 (ref 7.350–7.450)
pO2, Arterial: 178 mmHg — ABNORMAL HIGH (ref 83.0–108.0)

## 2020-03-15 SURGERY — RIGHT/LEFT HEART CATH AND CORONARY ANGIOGRAPHY
Anesthesia: LOCAL

## 2020-03-15 MED ORDER — GABAPENTIN 300 MG PO CAPS
300.0000 mg | ORAL_CAPSULE | Freq: Every day | ORAL | Status: DC
  Administered 2020-03-15 – 2020-03-16 (×2): 300 mg via ORAL
  Filled 2020-03-15 (×2): qty 1

## 2020-03-15 MED ORDER — CALCIUM CARBONATE-VITAMIN D 500-200 MG-UNIT PO TABS
2.0000 | ORAL_TABLET | Freq: Every day | ORAL | Status: DC
  Administered 2020-03-16 – 2020-03-17 (×2): 2 via ORAL
  Filled 2020-03-15 (×2): qty 2

## 2020-03-15 MED ORDER — FENTANYL CITRATE (PF) 100 MCG/2ML IJ SOLN
INTRAMUSCULAR | Status: DC | PRN
  Administered 2020-03-15: 25 ug via INTRAVENOUS

## 2020-03-15 MED ORDER — AMLODIPINE BESYLATE 10 MG PO TABS
10.0000 mg | ORAL_TABLET | Freq: Every day | ORAL | Status: DC
  Administered 2020-03-16 – 2020-03-17 (×2): 10 mg via ORAL
  Filled 2020-03-15 (×2): qty 1

## 2020-03-15 MED ORDER — ZOLPIDEM TARTRATE 5 MG PO TABS: 5.0000 mg | ORAL_TABLET | Freq: Every evening | ORAL | Status: DC | PRN

## 2020-03-15 MED ORDER — HYDRALAZINE HCL 25 MG PO TABS
12.5000 mg | ORAL_TABLET | Freq: Three times a day (TID) | ORAL | Status: DC
  Administered 2020-03-15 – 2020-03-17 (×5): 12.5 mg via ORAL
  Filled 2020-03-15 (×5): qty 1

## 2020-03-15 MED ORDER — HEPARIN (PORCINE) IN NACL 1000-0.9 UT/500ML-% IV SOLN
INTRAVENOUS | Status: DC | PRN
  Administered 2020-03-15 (×2): 500 mL

## 2020-03-15 MED ORDER — VERAPAMIL HCL 2.5 MG/ML IV SOLN
INTRAVENOUS | Status: DC | PRN
  Administered 2020-03-15: 10 mL via INTRA_ARTERIAL

## 2020-03-15 MED ORDER — ATORVASTATIN CALCIUM 10 MG PO TABS
20.0000 mg | ORAL_TABLET | Freq: Every day | ORAL | Status: DC
  Administered 2020-03-16 – 2020-03-17 (×2): 20 mg via ORAL
  Filled 2020-03-15 (×2): qty 2

## 2020-03-15 MED ORDER — IOHEXOL 350 MG/ML SOLN
INTRAVENOUS | Status: DC | PRN
  Administered 2020-03-15: 55 mL via INTRA_ARTERIAL

## 2020-03-15 MED ORDER — HEPARIN SODIUM (PORCINE) 1000 UNIT/ML IJ SOLN
INTRAMUSCULAR | Status: DC | PRN
  Administered 2020-03-15: 3500 [IU] via INTRAVENOUS

## 2020-03-15 MED ORDER — FENTANYL CITRATE (PF) 100 MCG/2ML IJ SOLN
INTRAMUSCULAR | Status: AC
  Filled 2020-03-15: qty 2

## 2020-03-15 MED ORDER — ESCITALOPRAM OXALATE 10 MG PO TABS
5.0000 mg | ORAL_TABLET | Freq: Every day | ORAL | Status: DC
  Administered 2020-03-16 – 2020-03-17 (×2): 5 mg via ORAL
  Filled 2020-03-15 (×2): qty 1

## 2020-03-15 MED ORDER — VERAPAMIL HCL 2.5 MG/ML IV SOLN
INTRAVENOUS | Status: AC
  Filled 2020-03-15: qty 2

## 2020-03-15 MED ORDER — HYDROCHLOROTHIAZIDE 25 MG PO TABS
25.0000 mg | ORAL_TABLET | Freq: Every day | ORAL | Status: DC
  Administered 2020-03-16 – 2020-03-17 (×2): 25 mg via ORAL
  Filled 2020-03-15 (×2): qty 1

## 2020-03-15 MED ORDER — HEPARIN SODIUM (PORCINE) 1000 UNIT/ML IJ SOLN
INTRAMUSCULAR | Status: AC
  Filled 2020-03-15: qty 1

## 2020-03-15 MED ORDER — LIDOCAINE HCL (PF) 1 % IJ SOLN
INTRAMUSCULAR | Status: AC
  Filled 2020-03-15: qty 30

## 2020-03-15 MED ORDER — HEPARIN (PORCINE) IN NACL 1000-0.9 UT/500ML-% IV SOLN
INTRAVENOUS | Status: AC
  Filled 2020-03-15: qty 1000

## 2020-03-15 MED ORDER — SODIUM CHLORIDE 0.9 % IV SOLN: 250.0000 mL | INTRAVENOUS | Status: DC | PRN

## 2020-03-15 MED ORDER — ASPIRIN EC 81 MG PO TBEC
81.0000 mg | DELAYED_RELEASE_TABLET | Freq: Every day | ORAL | Status: DC
  Administered 2020-03-16 – 2020-03-17 (×2): 81 mg via ORAL
  Filled 2020-03-15 (×2): qty 1

## 2020-03-15 MED ORDER — ROPINIROLE HCL 1 MG PO TABS
8.0000 mg | ORAL_TABLET | Freq: Every day | ORAL | Status: DC
  Filled 2020-03-15: qty 8

## 2020-03-15 MED ORDER — MIDAZOLAM HCL 2 MG/2ML IJ SOLN
INTRAMUSCULAR | Status: AC
  Filled 2020-03-15: qty 2

## 2020-03-15 MED ORDER — SODIUM CHLORIDE 0.9% FLUSH
3.0000 mL | Freq: Two times a day (BID) | INTRAVENOUS | Status: DC
  Administered 2020-03-16 (×2): 3 mL via INTRAVENOUS

## 2020-03-15 MED ORDER — LIDOCAINE HCL (PF) 1 % IJ SOLN
INTRAMUSCULAR | Status: DC | PRN
  Administered 2020-03-15 (×2): 2 mL via SUBCUTANEOUS

## 2020-03-15 MED ORDER — SODIUM CHLORIDE 0.9% FLUSH: 3.0000 mL | INTRAVENOUS | Status: DC | PRN

## 2020-03-15 MED ORDER — SODIUM CHLORIDE 0.9 % IV SOLN
INTRAVENOUS | Status: AC | PRN
  Administered 2020-03-15: 10 mL/h via INTRAVENOUS

## 2020-03-15 MED ORDER — ROPINIROLE HCL 1 MG PO TABS
8.0000 mg | ORAL_TABLET | Freq: Once | ORAL | Status: AC
  Administered 2020-03-15: 8 mg via ORAL
  Filled 2020-03-15: qty 8

## 2020-03-15 MED ORDER — PANTOPRAZOLE SODIUM 40 MG PO TBEC
40.0000 mg | DELAYED_RELEASE_TABLET | Freq: Every day | ORAL | Status: DC
  Administered 2020-03-16 – 2020-03-17 (×2): 40 mg via ORAL
  Filled 2020-03-15 (×2): qty 1

## 2020-03-15 MED ORDER — LABETALOL HCL 5 MG/ML IV SOLN: 10.0000 mg | INTRAVENOUS | Status: AC | PRN

## 2020-03-15 MED ORDER — MIDAZOLAM HCL 2 MG/2ML IJ SOLN
INTRAMUSCULAR | Status: DC | PRN
  Administered 2020-03-15: 1 mg via INTRAVENOUS

## 2020-03-15 MED ORDER — SODIUM CHLORIDE 0.9 % IV SOLN: INTRAVENOUS | Status: AC

## 2020-03-15 MED ORDER — ACETAMINOPHEN 325 MG PO TABS: 650.0000 mg | ORAL_TABLET | ORAL | Status: DC | PRN

## 2020-03-15 MED ORDER — ONDANSETRON HCL 4 MG/2ML IJ SOLN: 4.0000 mg | Freq: Four times a day (QID) | INTRAMUSCULAR | Status: DC | PRN

## 2020-03-15 MED ORDER — HYDRALAZINE HCL 20 MG/ML IJ SOLN: 10.0000 mg | INTRAMUSCULAR | Status: AC | PRN

## 2020-03-15 MED ORDER — HEPARIN SODIUM (PORCINE) 5000 UNIT/ML IJ SOLN
5000.0000 [IU] | Freq: Three times a day (TID) | INTRAMUSCULAR | Status: DC
  Administered 2020-03-15 – 2020-03-17 (×5): 5000 [IU] via SUBCUTANEOUS
  Filled 2020-03-15 (×6): qty 1

## 2020-03-15 SURGICAL SUPPLY — 12 items
CATH 5FR JL3.5 JR4 ANG PIG MP (CATHETERS) ×2 IMPLANT
CATH BALLN WEDGE 5F 110CM (CATHETERS) ×2 IMPLANT
DEVICE RAD COMP TR BAND LRG (VASCULAR PRODUCTS) ×2 IMPLANT
ELECT DEFIB PAD ADLT CADENCE (PAD) ×2 IMPLANT
GLIDESHEATH SLEND SS 6F .021 (SHEATH) ×2 IMPLANT
GUIDEWIRE INQWIRE 1.5J.035X260 (WIRE) ×1 IMPLANT
INQWIRE 1.5J .035X260CM (WIRE) ×2
KIT HEART LEFT (KITS) ×2 IMPLANT
PACK CARDIAC CATHETERIZATION (CUSTOM PROCEDURE TRAY) ×2 IMPLANT
SHEATH GLIDE SLENDER 4/5FR (SHEATH) ×2 IMPLANT
TRANSDUCER W/STOPCOCK (MISCELLANEOUS) ×2 IMPLANT
TUBING CIL FLEX 10 FLL-RA (TUBING) ×2 IMPLANT

## 2020-03-15 NOTE — Significant Event (Signed)
    Patient brought from [facility] for cath.  Changed to STEMI by carelink.  SHe is stable.  Tele at [facility] shows a 6 sec episode of complete heart block.  Stop Coreg.  Jettie Booze, MD

## 2020-03-15 NOTE — H&P (Addendum)
Cardiology Admission History and Physical:   Patient ID: Kelsey Rivera MRN: [id number]; DOB: March 25, 1946   Admission date: 03/15/2020  Primary Care Provider: Street, Sharon Mt, MD [facility] Cardiologist: [name], MD  [facility] Electrophysiologist:  None   Chief Complaint:  Chest pain, HF  Patient Profile:   Kelsey Rivera is a 74 y.o. female with past medical history of hypertension, GERD, arthritis, depression, anxiety and coronary artery disease on CT who presented to [facility] with chest tightness and shortness of breath.  Was found to have new onset systolic heart failure and mitral valve disease.  Transferred to [facility] for further evaluation.  History of Present Illness:   Kelsey Rivera is a 74 year old female with past medical history noted above.  She is followed by Dr. [name] as an outpatient.  She was last seen in the office in March 2021 where she reported no specific complaints.  She was continued on home medications without any adjustments with plans for follow-up in 6 months.  He presented to [facility] on 11/15 with episodes of chest tightness on exertion along with shortness of breath that have been present for the past several weeks.  On admission her labs showed stable electrolytes, troponin I peaked at 0.11, proBNP 5170, WBC 10.5, hemoglobin 13.  Chest x-ray with mild interstitial edema.  EKG showed sinus rhythm with first-degree AV block with left bundle branch block, T wave inversions in inferior lateral leads.  Echocardiogram showed EF of 45 to 50% with moderate mitral regurg, mildly enlarged RV and moderately dilated left atrium.  Cardiology was consulted and she was seen in consultation by Dr. [name].  Initially plan was to allow her to be discharged home and follow-up as an outpatient for cath.  Patient ambulated on the unit and had recurrent chest pain.  Decision was made to transfer to [facility] for further management with right  and left heart cardiac catheterization.  Of note while inpatient she was being diuresed and was started on Coreg 6.25 milligrams twice daily.  Received a total of 3 doses prior to transfer.  Just prior to [place] arrival patient was noted to have around a 6-second pause on telemetry and was symptomatic.  Been stable in route.  There was concern over possible EKG changes and code STEMI was activated while in route.  She arrived pain-free on admission.    Past Medical History:  Diagnosis Date   Arthritis of foot, left 01/09/2016   Baker's cyst of knee 02/06/2017   Chest pain in adult 04/25/2015   Overview:  Lexiscan  MPS with normal perfusion and function, EF 52%   Essential hypertension 04/26/2015   Extensor tendonitis of foot 12/14/2015   Overview:  Left   Hyperlipidemia 02/06/2017   Injury of left foot 07/25/2015   LBBB (left bundle branch block) 04/25/2015   Migraine 02/06/2017   Osteoarthritis 02/06/2017   Osteoarthritis of left foot 11/14/2015   Pathological fracture of metatarsal bone of left foot with routine healing 08/15/2015   RLS (restless legs syndrome) 04/26/2015    Past Surgical History:  Procedure Laterality Date   ABDOMINAL HYSTERECTOMY     CHOLECYSTECTOMY     FOOT FRACTURE SURGERY     TONSILLECTOMY     VEIN SURGERY       Medications Prior to Admission: Prior to Admission medications   Medication Sig Start Date End Date Taking? Authorizing Provider  albuterol (VENTOLIN HFA) 108 (90 Base) MCG/ACT inhaler  05/28/19   [provider]  amLODipine (NORVASC) 10 MG tablet Take 10 mg by mouth daily. 03/15/18   [provider]  aspirin EC 81 MG tablet Take 1 tablet (81 mg total) by mouth daily. 10/22/18   Revankar, Reita Cliche, MD  atorvastatin (LIPITOR) 20 MG tablet TAKE 1 TABLET BY MOUTH EVERY DAY 12/08/19   Revankar, Reita Cliche, MD  bumetanide (BUMEX) 1 MG tablet Take by mouth. 06/01/19   [provider]  Calcium Carb-Cholecalciferol (CALCIUM-VITAMIN D3)  600-400 MG-UNIT TABS Take 2 tablets by mouth daily. 07/13/19   [provider]  Calcium Carbonate-Vitamin D 600-400 MG-UNIT tablet TAKE 2 (TWO) TABLET BY MOUTH DAILY FOR OSTEOPENIA 03/05/18   [provider]  diazepam (VALIUM) 10 MG tablet TAKE 1 TABLET BY MOUTH AT BEDTIME EVERY OTHER NIGHT FOR ANXIETY 11/22/16   [provider]  escitalopram (LEXAPRO) 5 MG tablet Take 5 mg by mouth daily. 05/07/18   [provider]  esomeprazole (NEXIUM) 20 MG capsule Take 20 mg by mouth daily.     [provider]  fluticasone (FLONASE) 50 MCG/ACT nasal spray Place 1 spray into both nostrils daily as needed. 02/27/18   [provider]  gabapentin (NEURONTIN) 300 MG capsule take 1 capsule by mouth at bedtime 05/23/15   [provider]  hydrALAZINE (APRESOLINE) 25 MG tablet TAKE 0.5 TABLETS (12.5 MG TOTAL) BY MOUTH 3 (THREE) TIMES DAILY. 02/22/20   Revankar, Reita Cliche, MD  hydrochlorothiazide (HYDRODIURIL) 25 MG tablet TAKE 1 TABLET BY MOUTH EVERY DAY 02/21/17   [provider]  HYDROcodone-acetaminophen (NORCO/VICODIN) 5-325 MG tablet TAKE 1 TO 2 TABLETS BY MOUTH EVERY 8 HOURS AS NEEDED 04/29/19   [provider]  hydrOXYzine (ATARAX/VISTARIL) 25 MG tablet Take 25 mg by mouth daily as needed.    [provider]  methocarbamol (ROBAXIN) 500 MG tablet TAKE 2 (TWO) TABLET THREE TIMES DAILY AS NEEDED FOR BACK SPASMS 07/12/16   [provider]  nitroGLYCERIN (NITROSTAT) 0.4 MG SL tablet PLACE 1 TABLET (0.4 MG TOTAL) UNDER THE TONGUE EVERY 5 (FIVE) MINUTES AS NEEDED. 02/19/19 05/20/19  Revankar, Reita Cliche, MD  rOPINIRole (REQUIP) 4 MG tablet Take 2 tablets by mouth daily. 03/03/15   [provider]  telmisartan (MICARDIS) 80 MG tablet Take 80 mg by mouth daily. 04/13/18   [provider]  zolpidem (AMBIEN) 5 MG tablet Take 5 mg by mouth at bedtime as needed.  11/14/15   [provider]     Allergies:      Allergies  Allergen Reactions   Sulfa Antibiotics Rash    Rash and hives    Social History:   Social History   Socioeconomic History   Marital status: Married    Spouse name: Not on file   Number of children: Not on file   Years of education: Not on file   Highest education level: Not on file  Occupational History   Not on file  Tobacco Use   Smoking status: Never Smoker   Smokeless tobacco: Never Used  Vaping Use   Vaping Use: Never used  Substance and Sexual Activity   Alcohol use: No   Drug use: No   Sexual activity: Not on file  Other Topics Concern   Not on file  Social History Narrative   Not on file   Social Determinants of Health   Financial Resource Strain:    Difficulty of Paying Living Expenses: Not on file  Food Insecurity:    Worried About Running Out  of Food in the Last Year: Not on file   Ran Out of Food in the Last Year: Not on file  Transportation Needs:    Lack of Transportation (Medical): Not on file   Lack of Transportation (Non-Medical): Not on file  Physical Activity:    Days of Exercise per Week: Not on file   Minutes of Exercise per Session: Not on file  Stress:    Feeling of Stress : Not on file  Social Connections:    Frequency of Communication with Friends and Family: Not on file   Frequency of Social Gatherings with Friends and Family: Not on file   Attends Religious Services: Not on file   Active Member of Clubs or Organizations: Not on file   Attends Archivist Meetings: Not on file   Marital Status: Not on file  Intimate Partner Violence:    Fear of Current or Ex-Partner: Not on file   Emotionally Abused: Not on file   Physically Abused: Not on file   Sexually Abused: Not on file    Family History:   The patient's family history includes CAD in her brother; Cancer in her brother; Heart attack in her father and mother; Pulmonary embolism in her brother.    ROS:  Please see the history of present illness.  All  other ROS reviewed and negative.     Physical Exam/Data:   Vitals:   03/15/20 1307 03/15/20 1327  SpO2:  99%  Weight: 74.8 kg   Height: 5' (1.524 m)    No intake or output data in the 24 hours ending 03/15/20 1335 Last 3 Weights 03/15/2020 07/22/2019 07/20/2019  Weight (lbs) 165 lb 174 lb 175 lb  Weight (kg) 74.844 kg 78.926 kg 79.379 kg     Body mass index is 32.22 kg/m.  General:  Well nourished, well developed, obese older WF in no acute distress HEENT: normal Lymph: no adenopathy Neck: no JVD Endocrine:  No thryomegaly Vascular: No carotid bruits; FA pulses 2+ bilaterally without bruits  Cardiac:  normal S1, S2; RRR; + systolic murmur  Lungs:  clear to auscultation bilaterally, no wheezing, rhonchi or rales  Abd: soft, nontender, no hepatomegaly  Ext: no edema Musculoskeletal:  No deformities, BUE and BLE strength normal and equal Skin: warm and dry  Neuro:  CNs 2-12 intact, no focal abnormalities noted Psych:  Normal affect    EKG:  The ECG that was done 11/15 was personally reviewed and demonstrates SR with 1st degree AVB, LBBB, TWI in inferolateral leads  Relevant CV Studies:  Echo: (done at [facility]) EF 45-50%, moderate mitral regurg, mildly enlarged RV and moderately dilated left atrium.  Laboratory Data:  High Sensitivity Troponin:  No results for input(s): TROPONINIHS in the last 720 hours.    ChemistryNo results for input(s): NA, K, CL, CO2, GLUCOSE, BUN, CREATININE, CALCIUM, GFRNONAA, GFRAA, ANIONGAP in the last 168 hours.  No results for input(s): PROT, ALBUMIN, AST, ALT, ALKPHOS, BILITOT in the last 168 hours. HematologyNo results for input(s): WBC, RBC, HGB, HCT, MCV, MCH, MCHC, RDW, PLT in the last 168 hours. BNPNo results for input(s): BNP, PROBNP in the last 168 hours.  DDimer No results for input(s): DDIMER in the last 168 hours.   Radiology/Studies:  No results found.   Assessment and Plan:   Kelsey Rivera is a 74 y.o. female with past  medical history of hypertension, GERD, arthritis, depression, anxiety and coronary artery disease on CT who presented to [facility] with chest  tightness and shortness of breath.  Was found to have new onset systolic heart failure and mitral valve disease.  Transferred to [facility] for further evaluation.  1.  Dyspnea on exertion: proBNP > 5000 at [facility], was diuresed with significant improvement.  Echo there showed EF of 45 to 50%.  Plan for right and left heart cath.  Further recommendations for diuresis pending results.  2.  Coronary artery disease/elevated troponin: Troponin I peaked at 0.11.  Plans to undergo right heart cath given history of coronary disease based on prior CT. --Continue aspirin, statin  3.  Hypertension: She was started on carvedilol 6.25 mg twice daily while at [facility].  Had significant 6-second pause just prior to transfer.  Will DC beta-blocker. --Was on telmisartan 80 mg daily, along with hydrochlorothiazide 25 mg daily, hydralazine 12.5 mg 3 times daily and amlodipine 10mg  daily prior to admission  4.  Complete heart block: Noted to have 6-second episode on telemetry while at [place] prior to transfer.  Had not been on beta-blocker or calcium channel blocker therapy prior to admission.  Was started on carvedilol 6.25 mg twice daily.  Has received a total of 3 doses prior to transfer. --Hold beta-blocker therapy --Follow on telemetry --May need EP input if further episodes develop after beta-blocker washout  5. HLD: on statin therapy   Severity of Illness: The appropriate patient status for this patient is OBSERVATION. Observation status is judged to be reasonable and necessary in order to provide the required intensity of service to ensure the patient's safety. The patient's presenting symptoms, physical exam findings, and initial radiographic and laboratory data in the context of their medical condition is felt to place them at decreased  risk for further clinical deterioration. Furthermore, it is anticipated that the patient will be medically stable for discharge from the hospital within 2 midnights of admission. The following factors support the patient status of observation.   " The patient's presenting symptoms include shortness of breath, chest tightness. " The physical exam findings include stable exam. " The initial radiographic and laboratory data are elevated ProBNP, troponin I, echo with mildly reduced EF.     For questions or updates, please contact [facility] Please consult [email or web address] for contact info under     Signed, Reino Bellis, NP  03/15/2020 1:35 PM   I have examined the patient and reviewed assessment and plan and discussed with patient.  Agree with above as stated.   I personally reviewed the ECGs.  Patient with chronic LBBB.  Cath done due to decreased EF showed:  There is mild left ventricular systolic dysfunction. LV end diastolic pressure is normal. The left ventricular ejection fraction is 45-50% by visual estimate. There is no aortic valve stenosis. Ao 100%, PA 71%, PA pressure 21/8, mean PA pressure 13 mm Hg; mean PCWP 7 mm Hg; CO 4.17 L/min; CI 2.43. Normal right heart pressures. No significant CAD.   Consider EP eval given episodes of complete heart block with left bundle branch block.  Watch on tele. Hold Coreg, which was likely started for recent diagnosis of CHF.   Dr. [name] was also concerned about degree of MR.  Will defer to him regarding need for TEE.    Larae Grooms

## 2020-03-16 ENCOUNTER — Encounter (HOSPITAL_COMMUNITY): Payer: Self-pay | Admitting: Interventional Cardiology

## 2020-03-16 DIAGNOSIS — I10 Essential (primary) hypertension: Secondary | ICD-10-CM | POA: Diagnosis not present

## 2020-03-16 DIAGNOSIS — I447 Left bundle-branch block, unspecified: Secondary | ICD-10-CM | POA: Diagnosis not present

## 2020-03-16 DIAGNOSIS — R001 Bradycardia, unspecified: Secondary | ICD-10-CM | POA: Diagnosis not present

## 2020-03-16 DIAGNOSIS — I340 Nonrheumatic mitral (valve) insufficiency: Secondary | ICD-10-CM | POA: Diagnosis not present

## 2020-03-16 LAB — BASIC METABOLIC PANEL
Anion gap: 11 (ref 5–15)
BUN: 22 mg/dL (ref 8–23)
CO2: 27 mmol/L (ref 22–32)
Calcium: 9.3 mg/dL (ref 8.9–10.3)
Chloride: 101 mmol/L (ref 98–111)
Creatinine, Ser: 1.02 mg/dL — ABNORMAL HIGH (ref 0.44–1.00)
GFR, Estimated: 58 mL/min — ABNORMAL LOW (ref >60)
Glucose, Bld: 121 mg/dL — ABNORMAL HIGH (ref 70–99)
Potassium: 4.6 mmol/L (ref 3.5–5.1)
Sodium: 139 mmol/L (ref 135–145)

## 2020-03-16 MED ORDER — PNEUMOCOCCAL VAC POLYVALENT 25 MCG/0.5ML IJ INJ
0.5000 mL | INJECTION | INTRAMUSCULAR | Status: AC
  Administered 2020-03-17: 0.5 mL via INTRAMUSCULAR
  Filled 2020-03-16: qty 0.5

## 2020-03-16 MED ORDER — SODIUM CHLORIDE 0.9 % IV SOLN: INTRAVENOUS | Status: DC

## 2020-03-16 MED ORDER — ROPINIROLE HCL 0.5 MG PO TABS
4.0000 mg | ORAL_TABLET | Freq: Two times a day (BID) | ORAL | Status: DC
  Administered 2020-03-16 – 2020-03-17 (×3): 4 mg via ORAL
  Filled 2020-03-16 (×3): qty 8

## 2020-03-16 MED ORDER — INFLUENZA VAC A&B SA ADJ QUAD 0.5 ML IM PRSY
0.5000 mL | PREFILLED_SYRINGE | INTRAMUSCULAR | Status: AC
  Administered 2020-03-17: 0.5 mL via INTRAMUSCULAR
  Filled 2020-03-16: qty 0.5

## 2020-03-16 MED FILL — Lidocaine HCl Local Preservative Free (PF) Inj 1%: INTRAMUSCULAR | Qty: 30 | Status: AC

## 2020-03-16 NOTE — H&P (View-Only) (Signed)
The patient has been seen in conjunction with [name], NP. All aspects of care have been considered and discussed. The patient has been personally interviewed, examined, and all clinical data has been reviewed.   Transient high-grade AV block on low-dose beta-blocker therapy after admission to [facility].  This occurred in the setting of chronic left bundle branch block.  Will need 30-day monitor as an outpatient.  Cardiac cath and right heart cath were unremarkable.  No significant V wave on right heart.  No pulmonary hypertension.  Moderate MR by transthoracic echo at [facility]  As requested by Dr. [name], transesophageal echo will be done tomorrow.  If no complications she will be discharged at that point.  The patient strongly advocated for getting all of her testing done before she leaves the hospital.  Right inguinal cath sites or soft and without evidence of bleeding.   Progress Note  [name] Date of Encounter: 03/16/2020  [facility] Cardiologist: [name], MD   Subjective   Feeling well this morning.   Inpatient Medications    Scheduled Meds: . amLODipine  10 mg Oral Daily  . aspirin EC  81 mg Oral Daily  . atorvastatin  20 mg Oral Daily  . calcium-vitamin D  2 tablet Oral Q breakfast  . escitalopram  5 mg Oral Daily  . gabapentin  300 mg Oral QHS  . heparin  5,000 Units Subcutaneous Q8H  . hydrALAZINE  12.5 mg Oral TID  . hydrochlorothiazide  25 mg Oral Daily  . pantoprazole  40 mg Oral Daily  . rOPINIRole  8 mg Oral QHS  . sodium chloride flush  3 mL Intravenous Q12H   Continuous Infusions: . sodium chloride     PRN Meds: sodium chloride, acetaminophen, ondansetron (ZOFRAN) IV, sodium chloride flush, zolpidem   Vital Signs    Vitals:   03/15/20 1756 03/15/20 2020 03/15/20 2356 03/16/20 0415  BP: (!) 110/49 122/60 (!) 113/50 (!) 138/54  Pulse: (!) 53 (!) 59 (!) 57 (!) 57  Resp:  18 15 16   Temp:  98.6  F (37 C) 98 F (36.7 C) 98.6 F (37 C)  TempSrc:  Oral Oral Oral  SpO2: 95% 96% 92% 96%  Weight:    76.1 kg  Height:        Intake/Output Summary (Last 24 hours) at 03/16/2020 0750 Last data filed at 03/16/2020 0700 Gross per 24 hour  Intake 360 ml  Output 450 ml  Net -90 ml   Last 3 Weights 03/16/2020 03/15/2020 07/22/2019  Weight (lbs) 167 lb 12.8 oz 165 lb 174 lb  Weight (kg) 76.114 kg 74.844 kg 78.926 kg      Telemetry    SR with 1st degree AVB, LBBB - Personally Reviewed  ECG    No new tracing this morning  Physical Exam  Pleasant older female GEN: No acute distress.   Neck: No JVD Cardiac: RRR, + systolic murmur, no rubs, or gallops.  Respiratory: Clear to auscultation bilaterally. GI: Soft, nontender, non-distended  MS: No edema; No deformity. Right radial cath site stable.  Neuro:  Nonfocal  Psych: Normal affect   Labs    High Sensitivity Troponin:  No results for input(s): TROPONINIHS in the last 720 hours.    Chemistry Recent Labs  Lab 03/15/20 1343 03/15/20 1348 03/15/20 1349 03/15/20 1615  NA 140 139 138  --   K 5.0 5.5* 5.3*  --   CREATININE  --   --   --  1.26*  GFRNONAA  --   --   --  45*     Hematology Recent Labs  Lab 03/15/20 1348 03/15/20 1349 03/15/20 1615  WBC  --   --  11.1*  RBC  --   --  4.31  HGB 13.3 12.9 12.8  HCT 39.0 38.0 41.2  MCV  --   --  95.6  MCH  --   --  29.7  MCHC  --   --  31.1  RDW  --   --  14.2  PLT  --   --  336    BNPNo results for input(s): BNP, PROBNP in the last 168 hours.   DDimer No results for input(s): DDIMER in the last 168 hours.   Radiology    CARDIAC CATHETERIZATION  Result Date: 03/15/2020  There is mild left ventricular systolic dysfunction.  LV end diastolic pressure is normal.  The left ventricular ejection fraction is 45-50% by visual estimate.  There is no aortic valve stenosis.  Ao 100%, PA 71%, PA pressure 21/8, mean PA pressure 13 mm Hg; mean PCWP 7 mm Hg; CO 4.17  L/min; CI 2.43. Normal right heart pressures.  No significant CAD.  Consider EP eval given episodes of complete heart block with left bundle branch block.  Watch on tele. Dr. [name] was also concerned about mitral regurgitation.     Cardiac Studies   Cath: 03/15/20   There is mild left ventricular systolic dysfunction.  LV end diastolic pressure is normal.  The left ventricular ejection fraction is 45-50% by visual estimate.  There is no aortic valve stenosis.  Ao 100%, PA 71%, PA pressure 21/8, mean PA pressure 13 mm Hg; mean PCWP 7 mm Hg; CO 4.17 L/min; CI 2.43. Normal right heart pressures.  No significant CAD.   Consider EP eval given episodes of complete heart block with left bundle branch block.  Watch on tele.   Dr. [name] was also concerned about mitral regurgitation.        Patient Profile     74 y.o. female with past medical history of hypertension, GERD, arthritis, depression, anxiety and coronary artery disease on CT who presented to [facility] with chest tightness and shortness of breath.  Was found to have new onset systolic heart failure and mitral valve disease.  Transferred to [facility] for further evaluation.   Assessment & Plan    1. Dyspnea on exertion: proBNP > 5000 at [facility], was diuresed with significant improvement.  Echo there showed EF of 45 to 50%.  Cath yesterday noted above. Breathing is stable.  -- was on Bumex 2mg  PO daily prior to admission. Would plan to resume pending morning Cr.  2.  Coronary artery disease/elevated troponin: Troponin I peaked at 0.11. No significant CAD noted on cath. Continue medical management.  --Continue aspirin, statin  3.  Hypertension: She was started on carvedilol 6.25 mg twice daily while at [facility].  Had significant 6-second pause just prior to transfer.  This was stopped on transfer.  --Was on telmisartan 80 mg daily, along with hydrochlorothiazide 25 mg daily, hydralazine 12.5  mg 3 times daily and amlodipine 10mg  daily prior to admission.  -- plan to resume ARB pending BMET.  4.  Complete heart block: Noted to have 6-second episode on telemetry while at [facility] prior to transfer.  Had not been on beta-blocker or calcium channel blocker therapy prior to admission.  Was started on carvedilol 6.25 mg twice daily.  Has received  a total of 3 doses prior to transfer. --BB stopped on transfer, no further episodes noted  5. HLD: on statin therapy  6. Moderate MR: noted on echo at [place]. Initially suggested to obtain TEE to further evaluate. No room on the schedule today. Will review with MD to determine whether this needs to be completed as an inpatient.   For questions or updates, please contact [place] Please consult [email or web address] for contact info under   Signed, Reino Bellis, NP  03/16/2020, 7:50 AM

## 2020-03-16 NOTE — Progress Notes (Addendum)
The patient has been seen in conjunction with Kelsey Stains, NP. All aspects of care have been considered and discussed. The patient has been personally interviewed, examined, and all clinical data has been reviewed.   Transient high-grade AV block on low-dose beta-blocker therapy after admission to [facility].  This occurred in the setting of chronic left bundle branch block.  Will need 30-day monitor as an outpatient.  Cardiac cath and right heart cath were unremarkable.  No significant V wave on right heart.  No pulmonary hypertension.  Moderate MR by transthoracic echo at [facility]  As requested by Dr. Geraldo Rivera, transesophageal echo will be done tomorrow.  If no complications she will be discharged at that point.  The patient strongly advocated for getting all of her testing done before she leaves the hospital.  Right inguinal cath sites or soft and without evidence of bleeding.   Progress Note  [name] Date of Encounter: 03/16/2020  [facility] Cardiologist: Kelsey Lindau, MD   Subjective   Feeling well this morning.   Inpatient Medications    Scheduled Meds: . amLODipine  10 mg Oral Daily  . aspirin EC  81 mg Oral Daily  . atorvastatin  20 mg Oral Daily  . calcium-vitamin D  2 tablet Oral Q breakfast  . escitalopram  5 mg Oral Daily  . gabapentin  300 mg Oral QHS  . heparin  5,000 Units Subcutaneous Q8H  . hydrALAZINE  12.5 mg Oral TID  . hydrochlorothiazide  25 mg Oral Daily  . pantoprazole  40 mg Oral Daily  . rOPINIRole  8 mg Oral QHS  . sodium chloride flush  3 mL Intravenous Q12H   Continuous Infusions: . sodium chloride     PRN Meds: sodium chloride, acetaminophen, ondansetron (ZOFRAN) IV, sodium chloride flush, zolpidem   Vital Signs    Vitals:   03/15/20 1756 03/15/20 2020 03/15/20 2356 03/16/20 0415  BP: (!) 110/49 122/60 (!) 113/50 (!) 138/54  Pulse: (!) 53 (!) 59 (!) 57 (!) 57  Resp:  18 15 16   Temp:  98.6  F (37 C) 98 F (36.7 C) 98.6 F (37 C)  TempSrc:  Oral Oral Oral  SpO2: 95% 96% 92% 96%  Weight:    76.1 kg  Height:        Intake/Output Summary (Last 24 hours) at 03/16/2020 0750 Last data filed at 03/16/2020 0700 Gross per 24 hour  Intake 360 ml  Output 450 ml  Net -90 ml   Last 3 Weights 03/16/2020 03/15/2020 07/22/2019  Weight (lbs) 167 lb 12.8 oz 165 lb 174 lb  Weight (kg) 76.114 kg 74.844 kg 78.926 kg      Telemetry    SR with 1st degree AVB, LBBB - Personally Reviewed  ECG    No new tracing this morning  Physical Exam  Pleasant older female GEN: No acute distress.   Neck: No JVD Cardiac: RRR, + systolic murmur, no rubs, or gallops.  Respiratory: Clear to auscultation bilaterally. GI: Soft, nontender, non-distended  MS: No edema; No deformity. Right radial cath site stable.  Neuro:  Nonfocal  Psych: Normal affect   Labs    High Sensitivity Troponin:  No results for input(s): TROPONINIHS in the last 720 hours.    Chemistry Recent Labs  Lab 03/15/20 1343 03/15/20 1348 03/15/20 1349 03/15/20 1615  NA 140 139 138  --   K 5.0 5.5* 5.3*  --   CREATININE  --   --   --  1.26*  GFRNONAA  --   --   --  45*     Hematology Recent Labs  Lab 03/15/20 1348 03/15/20 1349 03/15/20 1615  WBC  --   --  11.1*  RBC  --   --  4.31  HGB 13.3 12.9 12.8  HCT 39.0 38.0 41.2  MCV  --   --  95.6  MCH  --   --  29.7  MCHC  --   --  31.1  RDW  --   --  14.2  PLT  --   --  336    BNPNo results for input(s): BNP, PROBNP in the last 168 hours.   DDimer No results for input(s): DDIMER in the last 168 hours.   Radiology    CARDIAC CATHETERIZATION  Result Date: 03/15/2020  There is mild left ventricular systolic dysfunction.  LV end diastolic pressure is normal.  The left ventricular ejection fraction is 45-50% by visual estimate.  There is no aortic valve stenosis.  Ao 100%, PA 71%, PA pressure 21/8, mean PA pressure 13 mm Hg; mean PCWP 7 mm Hg; CO 4.17  L/min; CI 2.43. Normal right heart pressures.  No significant CAD.  Consider EP eval given episodes of complete heart block with left bundle branch block.  Watch on tele. Dr. Geraldo Rivera was also concerned about mitral regurgitation.     Cardiac Studies   Cath: 03/15/20   There is mild left ventricular systolic dysfunction.  LV end diastolic pressure is normal.  The left ventricular ejection fraction is 45-50% by visual estimate.  There is no aortic valve stenosis.  Ao 100%, PA 71%, PA pressure 21/8, mean PA pressure 13 mm Hg; mean PCWP 7 mm Hg; CO 4.17 L/min; CI 2.43. Normal right heart pressures.  No significant CAD.   Consider EP eval given episodes of complete heart block with left bundle branch block.  Watch on tele.   Dr. Geraldo Rivera was also concerned about mitral regurgitation.        Patient Profile     74 y.o. female with past medical history of hypertension, GERD, arthritis, depression, anxiety and coronary artery disease on CT who presented to [facility] with chest tightness and shortness of breath.  Was found to have new onset systolic heart failure and mitral valve disease.  Transferred to [facility] for further evaluation.   Assessment & Plan    1. Dyspnea on exertion: proBNP > 5000 at [facility], was diuresed with significant improvement.  Echo there showed EF of 45 to 50%.  Cath yesterday noted above. Breathing is stable.  -- was on Bumex 2mg  PO daily prior to admission. Would plan to resume pending morning Cr.  2.  Coronary artery disease/elevated troponin: Troponin I peaked at 0.11. No significant CAD noted on cath. Continue medical management.  --Continue aspirin, statin  3.  Hypertension: She was started on carvedilol 6.25 mg twice daily while at [facility].  Had significant 6-second pause just prior to transfer.  This was stopped on transfer.  --Was on telmisartan 80 mg daily, along with hydrochlorothiazide 25 mg daily, hydralazine 12.5  mg 3 times daily and amlodipine 10mg  daily prior to admission.  -- plan to resume ARB pending BMET.  4.  Complete heart block: Noted to have 6-second episode on telemetry while at [place] prior to transfer.  Had not been on beta-blocker or calcium channel blocker therapy prior to admission.  Was started on carvedilol 6.25 mg twice daily.  Has received  a total of 3 doses prior to transfer. --BB stopped on transfer, no further episodes noted  5. HLD: on statin therapy  6. Moderate MR: noted on echo at [place]. Initially suggested to obtain TEE to further evaluate. No room on the schedule today. Will review with MD to determine whether this needs to be completed as an inpatient.   For questions or updates, please contact [place] Please consult [email or web address] for contact info under   Signed, Reino Bellis, NP  03/16/2020, 7:50 AM

## 2020-03-16 NOTE — Progress Notes (Addendum)
Progress Note  [name] Date of Encounter: 03/17/2020  [facility] Cardiologist: [name], MD   Subjective   No issues overnight.  N.p.o. and awaiting transesophageal echo.  Inpatient Medications    Scheduled Meds: . amLODipine  10 mg Oral Daily  . aspirin EC  81 mg Oral Daily  . atorvastatin  20 mg Oral Daily  . calcium-vitamin D  2 tablet Oral Q breakfast  . escitalopram  5 mg Oral Daily  . gabapentin  300 mg Oral QHS  . heparin  5,000 Units Subcutaneous Q8H  . hydrALAZINE  12.5 mg Oral TID  . hydrochlorothiazide  25 mg Oral Daily  . influenza vaccine adjuvanted  0.5 mL Intramuscular Tomorrow-1000  . pantoprazole  40 mg Oral Daily  . pneumococcal 23 valent vaccine  0.5 mL Intramuscular Tomorrow-1000  . rOPINIRole  4 mg Oral BID  . sodium chloride flush  3 mL Intravenous Q12H   Continuous Infusions: . sodium chloride    . sodium chloride     PRN Meds: sodium chloride, acetaminophen, ondansetron (ZOFRAN) IV, sodium chloride flush, zolpidem   Vital Signs    Vitals:   03/16/20 1504 03/16/20 2113 03/16/20 2128 03/17/20 0540  BP: (!) 131/42 (!) 144/53 139/70 (!) 139/59  Pulse:   65 75  Resp:   18 16  Temp:   98.3 F (36.8 C) 98.1 F (36.7 C)  TempSrc:   Oral Oral  SpO2:   96% 97%  Weight:    74.4 kg  Height:        Intake/Output Summary (Last 24 hours) at 03/17/2020 0724 Last data filed at 03/17/2020 0545 Gross per 24 hour  Intake 840 ml  Output 1000 ml  Net -160 ml   Last 3 Weights 03/17/2020 03/16/2020 03/15/2020  Weight (lbs) 164 lb 0.4 oz 167 lb 12.8 oz 165 lb  Weight (kg) 74.4 kg 76.114 kg 74.844 kg      Telemetry    Sinus rhythm with bundle branch block.  No excessive bradycardia.- Personally Reviewed  ECG    No new tracing- Personally Reviewed  Physical Exam  Lying flat in bed GEN: No acute distress.   Neck: No JVD Cardiac: RRR, with a 1-2 over 6 apical systolic murmur.  No rubs, or gallops.    Respiratory: Clear to auscultation bilaterally. GI: Soft, nontender, non-distended  MS: No edema; No deformity. Neuro:  Nonfocal  Psych: Normal affect   Labs    High Sensitivity Troponin:  No results for input(s): TROPONINIHS in the last 720 hours.    Chemistry Recent Labs  Lab 03/15/20 1348 03/15/20 1349 03/15/20 1615 03/16/20 0842  NA 139 138  --  139  K 5.5* 5.3*  --  4.6  CL  --   --   --  101  CO2  --   --   --  27  GLUCOSE  --   --   --  121*  BUN  --   --   --  22  CREATININE  --   --  1.26* 1.02*  CALCIUM  --   --   --  9.3  GFRNONAA  --   --  45* 26*  ANIONGAP  --   --   --  11     Hematology Recent Labs  Lab 03/15/20 1348 03/15/20 1349 03/15/20 1615  WBC  --   --  11.1*  RBC  --   --  4.31  HGB 13.3  12.9 12.8  HCT 39.0 38.0 41.2  MCV  --   --  95.6  MCH  --   --  29.7  MCHC  --   --  31.1  RDW  --   --  14.2  PLT  --   --  336    BNPNo results for input(s): BNP, PROBNP in the last 168 hours.   DDimer No results for input(s): DDIMER in the last 168 hours.   Radiology    CARDIAC CATHETERIZATION  Result Date: 03/15/2020  There is mild left ventricular systolic dysfunction.  LV end diastolic pressure is normal.  The left ventricular ejection fraction is 45-50% by visual estimate.  There is no aortic valve stenosis.  Ao 100%, PA 71%, PA pressure 21/8, mean PA pressure 13 mm Hg; mean PCWP 7 mm Hg; CO 4.17 L/min; CI 2.43. Normal right heart pressures.  No significant CAD.  Consider EP eval given episodes of complete heart block with left bundle branch block.  Watch on tele. Dr. [name] was also concerned about mitral regurgitation.     Cardiac Studies   See cath results above  Patient Profile     74 y.o. female with past medical history of hypertension, GERD, arthritis, depression, anxiety and coronary artery disease on CT who presented to [facility] with chest tightness, shortness of breath, and transient high grade AVB. New onset  systolic heart failure (EF 45%) and moderate mitral valve regurgitation noted on TTE disease.  Transferred for cath and evaluation of mitral valve disease.  Assessment & Plan    1. Probable chronic systolic heart failure, unknown duration.  Wall motion abnormality could be related to left bundle branch block related dyssynchrony. 2. Moderate mitral regurgitation for transesophageal echo today.  When alert and able, she will be discharged later today unless complications.   3. Nonobstructive coronary artery disease, without complaints. 4. Primary hypertension with reasonable in hospital blood pressure recordings 5. Complete heart block, transient: No alarms or pauses to suggest recurrent bradycardia/AV block.  Overall plan is probable discharge later today after transesophageal echo OR in a.m. if procedure is done late.  For questions or updates, please contact [place] Please consult [email or web address] for contact info under        Signed, Sinclair Grooms, MD  03/17/2020, 7:24 AM

## 2020-03-16 NOTE — Progress Notes (Signed)
Patient had 5 beat run of PVCs, upon assessment patient asymptomatic. Will continue to monitor patient.

## 2020-03-17 ENCOUNTER — Other Ambulatory Visit: Payer: Self-pay | Admitting: Physician Assistant

## 2020-03-17 ENCOUNTER — Inpatient Hospital Stay (HOSPITAL_COMMUNITY): Payer: Medicare Other | Admitting: Certified Registered"

## 2020-03-17 ENCOUNTER — Encounter (HOSPITAL_COMMUNITY)
Admission: AD | Disposition: A | Discharge status: Home / Self Care | Payer: Self-pay | Source: Other Acute Inpatient Hospital | Attending: Interventional Cardiology

## 2020-03-17 ENCOUNTER — Encounter (HOSPITAL_COMMUNITY): Payer: Self-pay | Admitting: Interventional Cardiology

## 2020-03-17 ENCOUNTER — Inpatient Hospital Stay (HOSPITAL_COMMUNITY): Payer: Medicare Other

## 2020-03-17 DIAGNOSIS — I2583 Coronary atherosclerosis due to lipid rich plaque: Secondary | ICD-10-CM

## 2020-03-17 DIAGNOSIS — I442 Atrioventricular block, complete: Secondary | ICD-10-CM

## 2020-03-17 DIAGNOSIS — I2510 Atherosclerotic heart disease of native coronary artery without angina pectoris: Secondary | ICD-10-CM | POA: Diagnosis not present

## 2020-03-17 DIAGNOSIS — R001 Bradycardia, unspecified: Secondary | ICD-10-CM | POA: Diagnosis not present

## 2020-03-17 DIAGNOSIS — I10 Essential (primary) hypertension: Secondary | ICD-10-CM | POA: Diagnosis not present

## 2020-03-17 DIAGNOSIS — I5022 Chronic systolic (congestive) heart failure: Secondary | ICD-10-CM

## 2020-03-17 DIAGNOSIS — I340 Nonrheumatic mitral (valve) insufficiency: Secondary | ICD-10-CM | POA: Diagnosis not present

## 2020-03-17 DIAGNOSIS — I447 Left bundle-branch block, unspecified: Secondary | ICD-10-CM

## 2020-03-17 HISTORY — PX: TEE WITHOUT CARDIOVERSION: SHX5443

## 2020-03-17 HISTORY — DX: Atrioventricular block, complete: I44.2

## 2020-03-17 LAB — ECHO TEE
MV M vel: 5.22 m/s
MV Peak grad: 109.1 mmHg
Radius: 0.69 cm

## 2020-03-17 SURGERY — ECHOCARDIOGRAM, TRANSESOPHAGEAL
Anesthesia: Monitor Anesthesia Care

## 2020-03-17 MED ORDER — PROPOFOL 10 MG/ML IV BOLUS
INTRAVENOUS | Status: DC | PRN
  Administered 2020-03-17: 40 mg via INTRAVENOUS
  Administered 2020-03-17: 20 mg via INTRAVENOUS

## 2020-03-17 MED ORDER — PROPOFOL 500 MG/50ML IV EMUL
INTRAVENOUS | Status: DC | PRN
  Administered 2020-03-17: 100 ug/kg/min via INTRAVENOUS

## 2020-03-17 NOTE — Op Note (Signed)
INDICATIONS: Mitral insufficiency  PROCEDURE:   Informed consent was obtained prior to the procedure. The risks, benefits and alternatives for the procedure were discussed and the patient comprehended these risks.  Risks include, but are not limited to, cough, sore throat, vomiting, nausea, somnolence, esophageal and stomach trauma or perforation, bleeding, low blood pressure, aspiration, pneumonia, infection, trauma to the teeth and death.    After a procedural time-out, the oropharynx was anesthetized with 20% benzocaine spray.   During this procedure the patient was administered IV propofol by Anesthesiology, Dr. Ermalene Postin.  The transesophageal probe was inserted in the esophagus and stomach without difficulty and multiple views were obtained.  The patient was kept under observation until the patient left the procedure room.  The patient left the procedure room in stable condition.   Agitated microbubble saline contrast was not administered.  COMPLICATIONS:    There were no immediate complications.  FINDINGS:  Low normal LVEF 50% with LBBB-related paradoxical septal motion. Mild-moderate MR, central jet.    Time Spent Directly with the Patient:  30 minutes   Kelsey Rivera 03/17/2020, 9:20 AM

## 2020-03-17 NOTE — Interval H&P Note (Signed)
History and Physical Interval Note:  03/17/2020 8:11 AM  Kelsey Rivera  has presented today for surgery, with the diagnosis of [place].  The various methods of treatment have been discussed with the patient and family. After consideration of risks, benefits and other options for treatment, the patient has consented to  Procedure(s): TRANSESOPHAGEAL ECHOCARDIOGRAM (TEE) (N/A) as a surgical intervention.  The patient's history has been reviewed, patient examined, no change in status, stable for surgery.  I have reviewed the patient's chart and labs.  Questions were answered to the patient's satisfaction.     [name]

## 2020-03-17 NOTE — Transfer of Care (Signed)
Immediate Anesthesia Transfer of Care Note  Patient: Kelsey Rivera  Procedure(s) Performed: TRANSESOPHAGEAL ECHOCARDIOGRAM (TEE) (N/A )  Patient Location: Endoscopy Unit  Anesthesia Type:MAC  Level of Consciousness: drowsy  Airway & Oxygen Therapy: Patient Spontanous Breathing and Patient connected to nasal cannula oxygen  Post-op Assessment: Report given to RN and Post -op Vital signs reviewed and stable  Post vital signs: Reviewed and stable  Last Vitals:  Vitals Value Taken Time  BP 132/43 03/17/20 0925  Temp    Pulse 55 03/17/20 0927  Resp 15 03/17/20 0927  SpO2 97 % 03/17/20 0927  Vitals shown include unvalidated device data.  Last Pain:  Vitals:   03/17/20 0831  TempSrc: Temporal  PainSc: 0-No pain      Patients Stated Pain Goal: 0 (48/88/91 [id number])  Complications: No complications documented.

## 2020-03-17 NOTE — Progress Notes (Signed)
  Echocardiogram 2D Echocardiogram has been performed.  Kelsey Rivera 03/17/2020, 9:30 AM

## 2020-03-17 NOTE — Anesthesia Procedure Notes (Signed)
Procedure Name: MAC Date/Time: 03/17/2020 9:14 AM Performed by: Imagene Riches, CRNA Pre-anesthesia Checklist: Patient identified, Emergency Drugs available, Suction available, Patient being monitored and Timeout performed Patient Re-evaluated:Patient Re-evaluated prior to induction Oxygen Delivery Method: Nasal cannula

## 2020-03-17 NOTE — Discharge Instructions (Signed)
Transesophageal Echocardiogram Transesophageal echocardiogram (TEE) is a test that uses sound waves to take pictures of your heart. TEE is done by passing a flexible tube down the esophagus. The esophagus is the tube that carries food from the throat to the stomach. The pictures give detailed images of your heart. This can help your doctor see if there are problems with your heart. What happens before the procedure? Staying hydrated Follow instructions from your doctor about hydration, which may include:  Up to 3 hours before the procedure - you may continue to drink clear liquids, such as: ? Water. ? Clear fruit juice. ? Black coffee. ? Plain tea.  Eating and drinking Follow instructions from your doctor about eating and drinking, which may include:  8 hours before the procedure - stop eating heavy meals or foods such as meat, fried foods, or fatty foods.  6 hours before the procedure - stop eating light meals or foods, such as toast or cereal.  6 hours before the procedure - stop drinking milk or drinks that contain milk.  3 hours before the procedure - stop drinking clear liquids. General instructions  You will need to take out any dentures or retainers.  Plan to have someone take you home from the hospital or clinic.  If you will be going home right after the procedure, plan to have someone with you for 24 hours.  Ask your doctor about: ? Changing or stopping your normal medicines. This is important if you take diabetes medicines or blood thinners. ? Taking over-the-counter medicines, vitamins, herbs, and supplements. ? Taking medicines such as aspirin and ibuprofen. These medicines can thin your blood. Do not take these medicines unless your doctor tells you to take them. What happens during the procedure?  To lower your risk of infection, your doctors will wash or clean their hands.  An IV will be put into one of your veins.  You will be given a medicine to help you  relax (sedative).  A medicine may be sprayed or gargled. This numbs the back of your throat.  Your blood pressure, heart rate, and breathing will be watched.  You may be asked to lay on your left side.  A bite block will be placed in your mouth. This keeps you from biting the tube.  The tip of the TEE probe will be placed into the back of your mouth.  You will be asked to swallow.  Your doctor will take pictures of your heart.  The probe and bite block will be taken out. The procedure may vary among doctors and hospitals. What happens after the procedure?   Your blood pressure, heart rate, breathing rate, and blood oxygen level will be watched until the medicines you were given have worn off.  When you first wake up, your throat may feel sore and numb. This will get better over time. You will not be allowed to eat or drink until the numbness has gone away.  Do not drive for 24 hours if you were given a medicine to help you relax. Summary  TEE is a test that uses sound waves to take pictures of your heart.  You will be given a medicine to help you relax.  Do not drive for 24 hours if you were given a medicine to help you relax. This information is not intended to replace advice given to you by your health care provider. Make sure you discuss any questions you have with your health care provider. Document Revised:   01/02/2018 Document Reviewed: 07/17/2016 Elsevier Patient Education  2020 Elsevier Inc.  

## 2020-03-17 NOTE — Anesthesia Preprocedure Evaluation (Addendum)
Anesthesia Evaluation  Patient identified by MRN, date of birth, ID band Patient awake    Reviewed: Allergy & Precautions, NPO status , Patient's Chart, lab work & pertinent test results  History of Anesthesia Complications Negative for: history of anesthetic complications  Airway Mallampati: II  TM Distance: >3 FB Neck ROM: Full    Dental  (+) Dental Advisory Given,    Pulmonary shortness of breath and with exertion,  Covid-19 Nucleic Acid Test Results Lab Results      Component                Value               Date                      SARSCOV2                 NEGATIVE            06/05/2019              breath sounds clear to auscultation       Cardiovascular hypertension, Pt. on medications + CAD and +CHF   Rhythm:Regular + Systolic murmurs  Nuclear stress EF: 63%.  The left ventricular ejection fraction is normal (55-65%).  There was no ST segment deviation noted during stress.  No T wave inversion was noted during stress.  Defect 1: There is a small defect of mild severity present in the apical anterior and apex location.  The study is normal.  This is a low risk study.      Neuro/Psych  Headaches, negative psych ROS   GI/Hepatic GERD  Medicated and Controlled,  Endo/Other    Renal/GU Renal diseaseLab Results      Component                Value               Date                      CREATININE               1.02 (H)            03/16/2020                Musculoskeletal  (+) Arthritis ,   Abdominal   Peds  Hematology Lab Results      Component                Value               Date                      WBC                      11.1 (H)            03/15/2020                HGB                      12.8                03/15/2020                HCT  41.2                03/15/2020                MCV                      95.6                03/15/2020                PLT                       336                 03/15/2020              Anesthesia Other Findings   Reproductive/Obstetrics                            Anesthesia Physical Anesthesia Plan  ASA: II  Anesthesia Plan: MAC   Post-op Pain Management:    Induction: Intravenous  PONV Risk Score and Plan: 2 and Propofol infusion and Treatment may vary due to age or medical condition  Airway Management Planned: Nasal Cannula  Additional Equipment: None  Intra-op Plan:   Post-operative Plan:   Informed Consent: I have reviewed the patients History and Physical, chart, labs and discussed the procedure including the risks, benefits and alternatives for the proposed anesthesia with the patient or authorized representative who has indicated his/her understanding and acceptance.     Dental advisory given  Plan Discussed with: CRNA and Surgeon  Anesthesia Plan Comments:         Anesthesia Quick Evaluation

## 2020-03-17 NOTE — Discharge Summary (Addendum)
The patient has been seen in conjunction with Fabian Sharp, PAC. All aspects of care have been considered and discussed. The patient has been personally interviewed, examined, and all clinical data has been reviewed.   The patient is doing well post TEE.  Mitral valve disease is felt to be mild without significant regurgitation.  Left and right heart catheterization did not reveal critical hemodynamics or significant dysfunction.  LVEF is low normal and possibly related to left bundle branch block.  Patient will be discharged today and have therapy including ARB and diuretic.  Beta-blocker therapy because transient pause/high-grade AV block and should be avoided in the future.  She may be at risk for permanent pacemaker therapy in the future.  Further management per Dr. [name].  Discharge Summary    Patient ID: Kelsey Rivera MRN: [id number]; DOB: 1946-04-17  Admit date: 03/15/2020 Discharge date: 03/17/2020  Primary Care Provider: Street, Sharon Mt, MD  Primary Cardiologist: [name], MD  Primary Electrophysiologist:  None   Discharge Diagnoses    Principal Problem:   Chronic systolic heart failure [facility]) Active Problems:   Primary hypertension   LBBB (left bundle branch block)   Hyperlipidemia   Mitral regurgitation   Chronic diastolic (congestive) heart failure (HCC)   CKD (chronic kidney disease) stage 3, GFR 30-59 ml/min (HCC)   Coronary artery disease due to lipid rich plaque   Symptomatic bradycardia   Transient complete heart block ([place])   Diagnostic Studies/Procedures    TEE 03/17/20: 1. Left ventricular ejection fraction, by estimation, is 50 to 55%. The  left ventricle has low normal function. The left ventricle has no regional  wall motion abnormalities.  2. Right ventricular systolic function is normal. The right ventricular  size is normal.  3. No left atrial/left atrial appendage thrombus was detected.  4. Effective mitral  regurgitant orifice area 0.22 cm sq, regurgitant  volume 38 ml. Vena contracta 0.22 cm. There is systolic dominant flow in  both the right and the left upper pulmonary veins.. The mitral valve is  normal in structure. Mild to moderate  mitral valve regurgitation. No evidence of mitral stenosis.  5. The aortic valve is normal in structure. Aortic valve regurgitation is  not visualized. No aortic stenosis is present.  6. There is mild (Grade II) protruding plaque involving the descending  aorta.  7. The inferior vena cava is normal in size with greater than 50%  respiratory variability, suggesting right atrial pressure of 3 mmHg.  _____________   Right/left heart cath 03/15/20:  There is mild left ventricular systolic dysfunction.  LV end diastolic pressure is normal.  The left ventricular ejection fraction is 45-50% by visual estimate.  There is no aortic valve stenosis.  Ao 100%, PA 71%, PA pressure 21/8, mean PA pressure 13 mm Hg; mean PCWP 7 mm Hg; CO 4.17 L/min; CI 2.43. Normal right heart pressures.  No significant CAD.   Consider EP eval given episodes of complete heart block with left bundle branch block.  Watch on tele.   Dr. [name] was also concerned about mitral regurgitation.         History of Present Illness     Kelsey Rivera is a 74 y.o. female with past medical history of hypertension, GERD, arthritis, depression, anxiety and coronary artery disease on CT who presented to [facility] with chest tightness and shortness of breath.  Was found to have new onset systolic heart failure and mitral valve disease.  Transferred to [facility] for further evaluation.  Kelsey Rivera is a 74 year old female with past medical history noted above.  She is followed by Dr. [name] as an outpatient.  She was last seen in the office in March 2021 where she reported no specific complaints.  She was continued on home medications without any adjustments with plans for follow-up  in 6 months.  He presented to [facility] on 11/15 with episodes of chest tightness on exertion along with shortness of breath that have been present for the past several weeks.  On admission her labs showed stable electrolytes, troponin I peaked at 0.11, proBNP 5170, WBC 10.5, hemoglobin 13.  Chest x-ray with mild interstitial edema.  EKG showed sinus rhythm with first-degree AV block with left bundle branch block, T wave inversions in inferior lateral leads.  Echocardiogram showed EF of 45 to 50% with moderate mitral regurg, mildly enlarged RV and moderately dilated left atrium.  Cardiology was consulted and she was seen in consultation by Dr. [name].  Initially plan was to allow her to be discharged home and follow-up as an outpatient for cath.  Patient ambulated on the unit and had recurrent chest pain.  Decision was made to transfer to [facility] for further management with right and left heart cardiac catheterization.  Of note while inpatient she was being diuresed and was started on Coreg 6.25 milligrams twice daily.  Received a total of 3 doses prior to transfer.  Just prior to [facility] arrival patient was noted to have around a 6-second pause on telemetry and was symptomatic.  Been stable in route.  There was concern over possible EKG changes and code STEMI was activated while in route.  She arrived pain-free on admission.  Hospital Course     Consultants:   Dyspnea on exertion - proBNP elevated at [facility] > 5000 - diuresed with significant improvement - will discharge on home bumex - echo with EF 45-50% by visual estimate - right and left heart cath were unremarkable, no significant V wave on RHC, no pulmonary hypertension - no significant CAD on heart cath - continue ASA and statin, no BB - continue bumex, HCTZ   Chronic LBBB Transient high-grade AV block - patient had a 6 second episode of CHB on telemetry whiel at [facility] prior to transfer - pt had not been on BB or CCB  prior to admission, but had been started on 3.25 mg BID coreg, which was stopped on transfer - no further episodes noted - have ordered a 30 day monitor to evaluate   Moderate MR - noted on TTE - TEE completed 03/17/20 which showed mild to moderate MR, no stenosis   Hypertension - avoid BB - continue telmisartan, HCTZ, hydralazine, and amlodipione   Hyperlipidemia - continue statin    Did the patient have an acute coronary syndrome (MI, NSTEMI, STEMI, etc) this admission?:  No                               Did the patient have a percutaneous coronary intervention (stent / angioplasty)?:  No.       _____________  Discharge Vitals Blood pressure (!) 135/57, pulse (!) 57, temperature 97.7 F (36.5 C), temperature source Oral, resp. rate 17, height 5' (1.524 m), weight 74.4 kg, SpO2 95 %.  Filed Weights   03/16/20 0415 03/17/20 0540 03/17/20 0831  Weight: 76.1 kg 74.4 kg 74.4 kg    Physical Exam Constitutional:  Appearance: Normal appearance. She is obese.  Eyes:     Pupils: Pupils are equal, round, and reactive to light.  Neck:     Vascular: No carotid bruit.  Cardiovascular:     Rate and Rhythm: Normal rate and regular rhythm.     Heart sounds: No murmur heard.   Pulmonary:     Effort: Pulmonary effort is normal.     Breath sounds: Normal breath sounds.  Abdominal:     General: Bowel sounds are normal.     Palpations: Abdomen is soft.  Musculoskeletal:     Right lower leg: No edema.     Left lower leg: No edema.  Skin:    General: Skin is warm and dry.  Neurological:     Mental Status: She is alert and oriented to person, place, and time.  Psychiatric:        Mood and Affect: Mood normal.        Behavior: Behavior normal.    Right radial cath site C/D/I  Labs & Radiologic Studies    CBC Recent Labs    03/15/20 1349 03/15/20 1615  WBC  --  11.1*  HGB 12.9 12.8  HCT 38.0 41.2  MCV  --  95.6  PLT  --  366   Basic Metabolic Panel Recent  Labs    03/15/20 1349 03/15/20 1615 03/16/20 0842  NA 138  --  139  K 5.3*  --  4.6  CL  --   --  101  CO2  --   --  27  GLUCOSE  --   --  121*  BUN  --   --  22  CREATININE  --  1.26* 1.02*  CALCIUM  --   --  9.3   Liver Function Tests No results for input(s): AST, ALT, ALKPHOS, BILITOT, PROT, ALBUMIN in the last 72 hours. No results for input(s): LIPASE, AMYLASE in the last 72 hours. High Sensitivity Troponin:   No results for input(s): TROPONINIHS in the last 720 hours.  BNP Invalid input(s): POCBNP D-Dimer No results for input(s): DDIMER in the last 72 hours. Hemoglobin A1C No results for input(s): HGBA1C in the last 72 hours. Fasting Lipid Panel No results for input(s): CHOL, HDL, LDLCALC, TRIG, CHOLHDL, LDLDIRECT in the last 72 hours. Thyroid Function Tests No results for input(s): TSH, T4TOTAL, T3FREE, THYROIDAB in the last 72 hours.  Invalid input(s): FREET3 _____________  CARDIAC CATHETERIZATION  Result Date: 03/15/2020  There is mild left ventricular systolic dysfunction.  LV end diastolic pressure is normal.  The left ventricular ejection fraction is 45-50% by visual estimate.  There is no aortic valve stenosis.  Ao 100%, PA 71%, PA pressure 21/8, mean PA pressure 13 mm Hg; mean PCWP 7 mm Hg; CO 4.17 L/min; CI 2.43. Normal right heart pressures.  No significant CAD.  Consider EP eval given episodes of complete heart block with left bundle branch block.  Watch on tele. Dr. [name] was also concerned about mitral regurgitation.    ECHO TEE  Result Date: 03/17/2020    TRANSESOPHOGEAL ECHO REPORT   Patient Name:   HAYDIN DUNN Date of Exam: 03/17/2020 Medical Rec #:  [id number]        Height:       60.0 in Accession #:    [id number]       Weight:       164.0 lb Date of Birth:  1946/02/13       BSA:  1.716 m Patient Age:    74 years         BP:           139/59 mmHg Patient Gender: F                HR:           63 bpm. Exam Location:  Inpatient  Procedure: Transesophageal Echo Indications:     Mitral Regurgitation  History:         Patient has no prior history of Echocardiogram examinations.                  Signs/Symptoms:Chest Pain; Risk Factors:Hypertension,                  Dyslipidemia and Non-Smoker.  Sonographer:     Vickie Epley RDCS Referring Phys:  [id number] [facility] ROBERTS Diagnosing Phys: Sanda Klein MD PROCEDURE: The transesophogeal probe was passed without difficulty through the esophogus of the patient. Sedation performed by different physician. The patient was monitored while under deep sedation. Anesthestetic sedation was provided intravenously by Anesthesiology: 134.4mg  of Propofol. The patient's vital signs; including heart rate, blood pressure, and oxygen saturation; remained stable throughout the procedure. The patient developed no complications during the procedure. IMPRESSIONS  1. Left ventricular ejection fraction, by estimation, is 50 to 55%. The left ventricle has low normal function. The left ventricle has no regional wall motion abnormalities.  2. Right ventricular systolic function is normal. The right ventricular size is normal.  3. No left atrial/left atrial appendage thrombus was detected.  4. Effective mitral regurgitant orifice area 0.22 cm sq, regurgitant volume 38 ml. Vena contracta 0.22 cm. There is systolic dominant flow in both the right and the left upper pulmonary veins.. The mitral valve is normal in structure. Mild to moderate mitral valve regurgitation. No evidence of mitral stenosis.  5. The aortic valve is normal in structure. Aortic valve regurgitation is not visualized. No aortic stenosis is present.  6. There is mild (Grade II) protruding plaque involving the descending aorta.  7. The inferior vena cava is normal in size with greater than 50% respiratory variability, suggesting right atrial pressure of 3 mmHg. Conclusion(s)/Recommendation(s): Normal biventricular function without evidence of hemodynamically  significant valvular heart disease. FINDINGS  Left Ventricle: Left ventricular ejection fraction, by estimation, is 50 to 55%. The left ventricle has low normal function. The left ventricle has no regional wall motion abnormalities. The left ventricular internal cavity size was normal in size. There is no left ventricular hypertrophy. Abnormal (paradoxical) septal motion, consistent with left bundle branch block. Right Ventricle: The right ventricular size is normal. No increase in right ventricular wall thickness. Right ventricular systolic function is normal. Left Atrium: Left atrial size was normal in size. No left atrial/left atrial appendage thrombus was detected. Right Atrium: Right atrial size was normal in size. Prominent Eustachian valve. Pericardium: There is no evidence of pericardial effusion. Mitral Valve: Effective mitral regurgitant orifice area 0.22 cm sq, regurgitant volume 38 ml. Vena contracta 0.22 cm. There is systolic dominant flow in both the right and the left upper pulmonary veins. The mitral valve is normal in structure. Mild to moderate mitral valve regurgitation, with centrally-directed jet. No evidence of mitral valve stenosis. Tricuspid Valve: The tricuspid valve is normal in structure. Tricuspid valve regurgitation is not demonstrated. No evidence of tricuspid stenosis. Aortic Valve: The aortic valve is normal in structure. Aortic valve regurgitation is not visualized. No aortic stenosis is present.  Pulmonic Valve: The pulmonic valve was normal in structure. Pulmonic valve regurgitation is not visualized. No evidence of pulmonic stenosis. Aorta: The aortic root is normal in size and structure. There is mild (Grade II) protruding plaque involving the descending aorta. Venous: The inferior vena cava is normal in size with greater than 50% respiratory variability, suggesting right atrial pressure of 3 mmHg. IAS/Shunts: No atrial level shunt detected by color flow Doppler.  MITRAL VALVE MV  VTI:      1.71 m MR Peak grad:    109.1 mmHg MR Vmax:         522.30 cm/s MR PISA Nyquist: 0.4 m/s     0.4 m/s MR PISA:         2.98 cm MR PISA Radius:  0.69 cm Mihai Croitoru MD Electronically signed by Sanda Klein MD Signature Date/Time: 03/17/2020/12:09:17 PM    Final    Disposition   Pt is being discharged home today in good condition.  Follow-up Plans & Appointments     Follow-up Information    Revankar, Reita Cliche, MD Follow up on 03/28/2020.   Specialty: Cardiology Why: 1:00 pm TOC Contact information: [address] [place] [address] [phone]              Discharge Instructions    Diet - low sodium heart healthy   Complete by: As directed    Diet - low sodium heart healthy   Complete by: As directed    Discharge instructions   Complete by: As directed    No driving for 2 days. No lifting over 5 lbs for 1 week. No sexual activity for 1 week. You may return to work in 1 week. Keep procedure site clean & dry. If you notice increased pain, swelling, bleeding or pus, call/return!  You may shower, but no soaking baths/hot tubs/pools for 1 week.   Discharge instructions   Complete by: As directed    No driving for 2 days. No lifting over 5 lbs for 1 week. No sexual activity for 1 week. You may return to work in 1 week. Keep procedure site clean & dry. If you notice increased pain, swelling, bleeding or pus, call/return!  You may shower, but no soaking baths/hot tubs/pools for 1 week.   Increase activity slowly   Complete by: As directed    Increase activity slowly   Complete by: As directed    No wound care   Complete by: As directed    No wound care   Complete by: As directed       Discharge Medications   Allergies as of 03/17/2020      Reactions   Lasix [furosemide] Other (See Comments)   Per pt damages kidneys    Sulfa Antibiotics Hives, Rash      Medication List    TAKE these medications   albuterol 108 (90 Base) MCG/ACT inhaler Commonly known  as: VENTOLIN HFA Inhale 1-2 puffs into the lungs as needed for wheezing or shortness of breath.   amLODipine 10 MG tablet Commonly known as: NORVASC Take 10 mg by mouth daily.   aspirin EC 81 MG tablet Take 1 tablet (81 mg total) by mouth daily.   atorvastatin 20 MG tablet Commonly known as: LIPITOR TAKE 1 TABLET BY MOUTH EVERY DAY   bumetanide 2 MG tablet Commonly known as: BUMEX Take 2 mg by mouth daily.   Calcium-Vitamin D3 600-400 MG-UNIT Tabs Take 2 tablets by mouth daily.   diazepam 10 MG tablet  Commonly known as: VALIUM Take 10 mg by mouth See admin instructions. Take every other night for anxiety.   escitalopram 5 MG tablet Commonly known as: LEXAPRO Take 5 mg by mouth daily.   esomeprazole 20 MG capsule Commonly known as: NEXIUM Take 20 mg by mouth daily.   fluticasone 50 MCG/ACT nasal spray Commonly known as: FLONASE Place 1 spray into both nostrils as needed for allergies.   gabapentin 300 MG capsule Commonly known as: NEURONTIN Take 300 mg by mouth at bedtime.   hydrALAZINE 25 MG tablet Commonly known as: APRESOLINE TAKE 0.5 TABLETS (12.5 MG TOTAL) BY MOUTH 3 (THREE) TIMES DAILY.   hydrochlorothiazide 25 MG tablet Commonly known as: HYDRODIURIL Take 25 mg by mouth daily.   hydrOXYzine 25 MG tablet Commonly known as: ATARAX/VISTARIL Take 25 mg by mouth as needed for anxiety.   ketoconazole 2 % cream Commonly known as: NIZORAL Apply 1 application topically as needed.   methocarbamol 500 MG tablet Commonly known as: ROBAXIN Take 1,000 mg by mouth daily as needed for muscle spasms (back spasms).   nitroGLYCERIN 0.4 MG SL tablet Commonly known as: NITROSTAT PLACE 1 TABLET (0.4 MG TOTAL) UNDER THE TONGUE EVERY 5 (FIVE) MINUTES AS NEEDED.   nystatin powder Commonly known as: MYCOSTATIN/NYSTOP Apply 1 application topically as directed.   Potassium Chloride ER 20 MEQ Tbcr Take 20 mEq by mouth daily.   rOPINIRole 4 MG tablet Commonly known  as: REQUIP Take 8 mg by mouth daily.   telmisartan 80 MG tablet Commonly known as: MICARDIS Take 80 mg by mouth daily.   zolpidem 5 MG tablet Commonly known as: AMBIEN Take 5 mg by mouth at bedtime as needed for sleep.          Outstanding Labs/Studies   Heart monitor  Duration of Discharge Encounter   Greater than 30 minutes including physician time.  Signed, Tami Lin Duke, PA 03/17/2020, 1:35 PM

## 2020-03-17 NOTE — Consult Note (Addendum)
error 

## 2020-03-19 ENCOUNTER — Encounter (HOSPITAL_COMMUNITY): Payer: Self-pay | Admitting: Cardiovascular Disease

## 2020-03-19 NOTE — Anesthesia Postprocedure Evaluation (Signed)
Anesthesia Post Note  Patient: Kelsey Rivera  Procedure(s) Performed: TRANSESOPHAGEAL ECHOCARDIOGRAM (TEE) (N/A )     Patient location during evaluation: Endoscopy Anesthesia Type: MAC Level of consciousness: awake and alert Pain management: pain level controlled Vital Signs Assessment: post-procedure vital signs reviewed and stable Respiratory status: spontaneous breathing, nonlabored ventilation, respiratory function stable and patient connected to nasal cannula oxygen Cardiovascular status: stable and blood pressure returned to baseline Postop Assessment: no apparent nausea or vomiting Anesthetic complications: no   No complications documented.  Last Vitals:  Vitals:   03/17/20 1005 03/17/20 1058  BP: (!) 143/55 (!) 135/57  Pulse:    Resp:    Temp: 36.5 C   SpO2: 95%     Last Pain:  Vitals:   03/17/20 1005  TempSrc: Oral  PainSc: 0-No pain                 [place]

## 2020-03-20 ENCOUNTER — Encounter: Payer: Self-pay | Admitting: *Deleted

## 2020-03-20 ENCOUNTER — Telehealth: Payer: Self-pay | Admitting: *Deleted

## 2020-03-20 NOTE — Telephone Encounter (Signed)
Patient enrolled for [employer] to ship a 30 day cardiac event monitor to her home.  Letter with instructions mailed to patient.

## 2020-03-21 DIAGNOSIS — N1832 Chronic kidney disease, stage 3b: Secondary | ICD-10-CM | POA: Diagnosis not present

## 2020-03-21 DIAGNOSIS — I5032 Chronic diastolic (congestive) heart failure: Secondary | ICD-10-CM | POA: Diagnosis not present

## 2020-03-21 DIAGNOSIS — R040 Epistaxis: Secondary | ICD-10-CM | POA: Diagnosis not present

## 2020-03-24 DIAGNOSIS — R040 Epistaxis: Secondary | ICD-10-CM

## 2020-03-24 HISTORY — DX: Epistaxis: R04.0

## 2020-03-27 DIAGNOSIS — M1990 Unspecified osteoarthritis, unspecified site: Secondary | ICD-10-CM | POA: Diagnosis not present

## 2020-03-27 DIAGNOSIS — N1832 Chronic kidney disease, stage 3b: Secondary | ICD-10-CM | POA: Diagnosis not present

## 2020-03-28 ENCOUNTER — Ambulatory Visit: Payer: Medicare Other | Admitting: Cardiology

## 2020-03-29 ENCOUNTER — Telehealth: Payer: Self-pay | Admitting: *Deleted

## 2020-03-29 ENCOUNTER — Other Ambulatory Visit: Payer: Self-pay

## 2020-03-29 ENCOUNTER — Ambulatory Visit (INDEPENDENT_AMBULATORY_CARE_PROVIDER_SITE_OTHER): Payer: Medicare Other

## 2020-03-29 DIAGNOSIS — I442 Atrioventricular block, complete: Secondary | ICD-10-CM | POA: Diagnosis not present

## 2020-03-29 DIAGNOSIS — I447 Left bundle-branch block, unspecified: Secondary | ICD-10-CM | POA: Diagnosis not present

## 2020-03-29 NOTE — Telephone Encounter (Signed)
Pt brought her event monitor that she received in the mail for [place] to put on. It did not come with a box so contacted Mitch at [employer] to please mail pt the box to send it back in. Monitor serial number is [id number] and it was placed in the office.

## 2020-03-30 ENCOUNTER — Other Ambulatory Visit: Payer: Self-pay

## 2020-03-30 ENCOUNTER — Encounter: Payer: Self-pay | Admitting: Cardiology

## 2020-03-30 ENCOUNTER — Ambulatory Visit: Payer: Medicare Other | Admitting: Cardiology

## 2020-03-30 VITALS — BP 152/71 | HR 70 | Ht 60.0 in | Wt 164.0 lb

## 2020-03-30 DIAGNOSIS — I447 Left bundle-branch block, unspecified: Secondary | ICD-10-CM

## 2020-03-30 DIAGNOSIS — M19071 Primary osteoarthritis, right ankle and foot: Secondary | ICD-10-CM | POA: Diagnosis not present

## 2020-03-30 DIAGNOSIS — M19272 Secondary osteoarthritis, left ankle and foot: Secondary | ICD-10-CM | POA: Diagnosis not present

## 2020-03-30 DIAGNOSIS — E782 Mixed hyperlipidemia: Secondary | ICD-10-CM | POA: Diagnosis not present

## 2020-03-30 DIAGNOSIS — I442 Atrioventricular block, complete: Secondary | ICD-10-CM

## 2020-03-30 DIAGNOSIS — I10 Essential (primary) hypertension: Secondary | ICD-10-CM | POA: Diagnosis not present

## 2020-03-30 DIAGNOSIS — I340 Nonrheumatic mitral (valve) insufficiency: Secondary | ICD-10-CM | POA: Diagnosis not present

## 2020-03-30 NOTE — Progress Notes (Signed)
Cardiology Office Note:    Date:  03/30/2020   ID:  Kelsey Rivera, DOB 07/23/1945, MRN [id number]  PCP:  Street, Sharon Mt, MD  Cardiologist:  [name], MD   Referring MD: Street, Sharon Mt, *    ASSESSMENT:    1. Primary hypertension   2. LBBB (left bundle branch block)   3. Mixed hyperlipidemia   4. Mitral valve insufficiency, unspecified etiology   5. Transient complete heart block (HCC)    PLAN:    In order of problems listed above:  1. Primary prevention stressed with the patient.  Importance of compliance with diet medication stressed and she vocalized understanding. 2. Essential hypertension: Diet was emphasized.  Blood pressure stable and she will keep a track of it twice a day and see me follow-up appointment in 6 weeks and give me the blood pressure log.  Diet was discussed lifestyle modification and salt intake issues were discussed extensively. 3. Mixed dyslipidemia: Discussed.  Diet emphasized.  Patient is on lipid-lowering therapy. 4. Obesity: Weight reduction stressed patient promises to do better with diet and exercise. 5. Mild to moderate mitral regurgitation: I discussed this with her at length.  Blood pressure optimization will be pursued.  Medical management at this time.  Patient had multiple questions which were answered to her satisfaction. 6. Patient has high-grade AV block and left bundle branch block noted on the monitor during the hospitalization.  We will refer her to our electrophysiology colleagues for that aspect of her care and recommendations.  She is currently wearing an event monitor.  This will help assess this condition.   Medication Adjustments/Labs and Tests Ordered: Current medicines are reviewed at length with the patient today.  Concerns regarding medicines are outlined above.  No orders of the defined types were placed in this encounter.  No orders of the defined types were placed in this encounter.    No chief  complaint on file.    History of Present Illness:    Kelsey Rivera is a 74 y.o. female.  Patient has past medical history of congestive heart failure.  I sent her up for coronary angiography and this revealed unremarkable coronary arteries.  She has calcifications in the coronary arteries.  Her ejection fraction was preserved and she has mild to moderate mitral regurgitation.  Subsequently she is done fine.  She is walking on a regular basis.  No chest pain orthopnea or PND.  At the time of my evaluation, the patient is alert awake oriented and in no distress.  Past Medical History:  Diagnosis Date  . AKI (acute kidney injury) ([place]) 05/20/2018  . Arthritis of foot, left 01/09/2016  . Baker's cyst of knee 02/06/2017  . Bilateral lower extremity edema 05/20/2018  . Chest pain in adult 04/25/2015   Overview:  Lexiscan  MPS with normal perfusion and function, EF 52%  . Chronic diastolic (congestive) heart failure ([place]) 05/20/2018   Echo - 04/2015 EF 40-98% Grade II diastolic dysfunction. Moderate mitral regurgitation  . Chronic systolic heart failure ([place])   . CKD (chronic kidney disease) stage 3, GFR 30-59 ml/min (HCC) 05/20/2018  . Closed dislocation of right elbow 04/24/2019   Added automatically from request for surgery [phone]  . Coronary artery disease due to lipid rich plaque 10/22/2018  . Degenerative arthritis of right foot 02/23/2020  . Epistaxis 03/24/2020  . Essential hypertension 04/26/2015  . Extensor tendonitis of foot 12/14/2015   Overview:  Left  . Finger pain, right 05/10/2019  .  Hyperlipidemia 02/06/2017  . Injury of left foot 07/25/2015  . LBBB (left bundle branch block) 04/25/2015  . Migraine 02/06/2017  . Mitral regurgitation 12/17/2017  . Osteoarthritis 02/06/2017  . Osteoarthritis of left foot 11/14/2015  . Painful orthopaedic hardware ([place]) 09/20/2019  . Pathological fracture of metatarsal bone of left foot with routine healing 08/15/2015  . Plantar fasciitis of left  foot 06/03/2017  . Primary hypertension 04/26/2015  . RLS (restless legs syndrome) 04/26/2015  . Status post hardware removal 11/04/2019  . Symptomatic bradycardia 03/15/2020  . Transient complete heart block ([place]) 03/17/2020  . Venous ulcer of left leg ([place]) 02/19/2017    Past Surgical History:  Procedure Laterality Date  . ABDOMINAL HYSTERECTOMY    . CHOLECYSTECTOMY    . FOOT FRACTURE SURGERY    . RIGHT/LEFT HEART CATH AND CORONARY ANGIOGRAPHY N/A 03/15/2020   Procedure: RIGHT/LEFT HEART CATH AND CORONARY ANGIOGRAPHY;  Surgeon: Jettie Booze, MD;  Location: [facility];  Service: Cardiovascular;  Laterality: N/A;  . TEE WITHOUT CARDIOVERSION N/A 03/17/2020   Procedure: TRANSESOPHAGEAL ECHOCARDIOGRAM (TEE);  Surgeon: Sanda Klein, MD;  Location: [facility] ENDOS[facility];  Service: Cardiovascular;  Laterality: N/A;  . TONSILLECTOMY    . VEIN SURGERY      Current Medications: Current Meds  Medication Sig  . albuterol (VENTOLIN HFA) 108 (90 Base) MCG/ACT inhaler Inhale 1-2 puffs into the lungs as needed for wheezing or shortness of breath.   Marland Kitchen amLODipine (NORVASC) 10 MG tablet Take 10 mg by mouth daily.  Marland Kitchen aspirin EC 81 MG tablet Take 1 tablet (81 mg total) by mouth daily.  Marland Kitchen atorvastatin (LIPITOR) 20 MG tablet TAKE 1 TABLET BY MOUTH EVERY DAY  . azithromycin (ZITHROMAX) 500 MG tablet Take 500 mg by mouth daily.  . bumetanide (BUMEX) 1 MG tablet Take 1 tablet by mouth daily.  . Calcium Carb-Cholecalciferol (CALCIUM-VITAMIN D3) 600-400 MG-UNIT TABS Take 2 tablets by mouth daily.  . diazepam (VALIUM) 10 MG tablet Take 10 mg by mouth See admin instructions. Take every other night for anxiety.  Marland Kitchen escitalopram (LEXAPRO) 5 MG tablet Take 5 mg by mouth daily.  Marland Kitchen esomeprazole (NEXIUM) 20 MG capsule Take 20 mg by mouth daily.   Marland Kitchen gabapentin (NEURONTIN) 300 MG capsule Take 300 mg by mouth at bedtime.   . hydrALAZINE (APRESOLINE) 25 MG tablet TAKE 0.5 TABLETS (12.5 MG TOTAL) BY MOUTH 3  (THREE) TIMES DAILY.  . hydrochlorothiazide (HYDRODIURIL) 25 MG tablet Take 25 mg by mouth daily.   . hydrOXYzine (ATARAX/VISTARIL) 25 MG tablet Take 25 mg by mouth as needed for anxiety.   Marland Kitchen ketoconazole (NIZORAL) 2 % cream Apply 1 application topically as needed.  . methocarbamol (ROBAXIN) 500 MG tablet Take 1,000 mg by mouth daily as needed for muscle spasms (back spasms).   . methylPREDNISolone (MEDROL DOSEPAK) 4 MG TBPK tablet Take by mouth.  . Potassium Chloride ER 20 MEQ TBCR Take 20 mEq by mouth daily.  Marland Kitchen rOPINIRole (REQUIP) 4 MG tablet Take 8 mg by mouth daily.   Marland Kitchen telmisartan (MICARDIS) 80 MG tablet Take 80 mg by mouth daily.  Marland Kitchen zolpidem (AMBIEN) 5 MG tablet Take 5 mg by mouth at bedtime as needed for sleep.      Allergies:   Lasix [furosemide] and Sulfa antibiotics   Social History   Socioeconomic History  . Marital status: Widowed    Spouse name: Not on file  . Number of children: 2  . Years of education: 55  . Highest education  level: High school graduate  Occupational History  . Occupation: retired   Tobacco Use  . Smoking status: Never Smoker  . Smokeless tobacco: Never Used  Vaping Use  . Vaping Use: Never used  Substance and Sexual Activity  . Alcohol use: No  . Drug use: No  . Sexual activity: Not on file  Other Topics Concern  . Not on file  Social History Narrative  . Not on file   Social Determinants of Health   Financial Resource Strain: Low Risk   . Difficulty of Paying Living Expenses: Not hard at all  Food Insecurity: No Food Insecurity  . Worried About Charity fundraiser in the Last Year: Never true  . Ran Out of Food in the Last Year: Never true  Transportation Needs: No Transportation Needs  . Lack of Transportation (Medical): No  . Lack of Transportation (Non-Medical): No  Physical Activity: Insufficiently Active  . Days of Exercise per Week: 3 days  . Minutes of Exercise per Session: 30 min  Stress: No Stress Concern Present  .  Feeling of Stress : Not at all  Social Connections: Moderately Integrated  . Frequency of Communication with Friends and Family: More than three times a week  . Frequency of Social Gatherings with Friends and Family: More than three times a week  . Attends Religious Services: More than 4 times per year  . Active Member of Clubs or Organizations: Yes  . Attends Archivist Meetings: More than 4 times per year  . Marital Status: Widowed     Family History: The patient's family history includes CAD in her brother; Cancer in her brother; Heart attack in her father and mother; Pulmonary embolism in her brother.  ROS:   Please see the history of present illness.    All other systems reviewed and are negative.  EKGs/Labs/Other Studies Reviewed:    The following studies were reviewed today: RIGHT/LEFT HEART CATH AND CORONARY ANGIOGRAPHY  Conclusion    There is mild left ventricular systolic dysfunction.  LV end diastolic pressure is normal.  The left ventricular ejection fraction is 45-50% by visual estimate.  There is no aortic valve stenosis.  Ao 100%, PA 71%, PA pressure 21/8, mean PA pressure 13 mm Hg; mean PCWP 7 mm Hg; CO 4.17 L/min; CI 2.43. Normal right heart pressures.  No significant CAD.   Consider EP eval given episodes of complete heart block with left bundle branch block.  Watch on tele.   Dr. [name] was also concerned about mitral regurgitation.         IMPRESSIONS    1. Left ventricular ejection fraction, by estimation, is 50 to 55%. The  left ventricle has low normal function. The left ventricle has no regional  wall motion abnormalities.  2. Right ventricular systolic function is normal. The right ventricular  size is normal.  3. No left atrial/left atrial appendage thrombus was detected.  4. Effective mitral regurgitant orifice area 0.22 cm sq, regurgitant  volume 38 ml. Vena contracta 0.22 cm. There is systolic dominant flow in  both  the right and the left upper pulmonary veins.. The mitral valve is  normal in structure. Mild to moderate  mitral valve regurgitation. No evidence of mitral stenosis.  5. The aortic valve is normal in structure. Aortic valve regurgitation is  not visualized. No aortic stenosis is present.  6. There is mild (Grade II) protruding plaque involving the descending  aorta.  7. The inferior vena cava is  normal in size with greater than 50%  respiratory variability, suggesting right atrial pressure of 3 mmHg.   Conclusion(s)/Recommendation(s): Normal biventricular function without  evidence of hemodynamically significant valvular heart disease.        Recent Labs: 06/16/2019: ALT 12 03/15/2020: Hemoglobin 12.8; Platelets 336 03/16/2020: BUN 22; Creatinine, Ser 1.02; Potassium 4.6; Sodium 139  Recent Lipid Panel    Component Value Date/Time   CHOL 144 02/10/2019 0816   TRIG 54 02/10/2019 0816   HDL 70 02/10/2019 0816   CHOLHDL 2.1 02/10/2019 0816   LDLCALC 62 02/10/2019 0816    Physical Exam:    VS:  BP (!) 152/71   Pulse 70   Ht 5' (1.524 m)   Wt 164 lb (74.4 kg)   SpO2 97%   BMI 32.03 kg/m     Wt Readings from Last 3 Encounters:  03/30/20 164 lb (74.4 kg)  03/17/20 164 lb 0.4 oz (74.4 kg)  07/22/19 174 lb (78.9 kg)     GEN: Patient is in no acute distress HEENT: Normal NECK: No JVD; No carotid bruits LYMPHATICS: No lymphadenopathy CARDIAC: Hear sounds regular, 2/6 systolic murmur at the apex. RESPIRATORY:  Clear to auscultation without rales, wheezing or rhonchi  ABDOMEN: Soft, non-tender, non-distended MUSCULOSKELETAL:  No edema; No deformity  SKIN: Warm and dry NEUROLOGIC:  Alert and oriented x 3 PSYCHIATRIC:  Normal affect   Signed, [name], MD  03/30/2020 3:46 PM    [facility]

## 2020-03-30 NOTE — Patient Instructions (Signed)
Medication Instructions:  No medication changes. *If you need a refill on your cardiac medications before your next appointment, please call your pharmacy*   Lab Work: Your physician recommends that you have a BMET today in the office today.  If you have labs (blood work) drawn today and your tests are completely normal, you will receive your results only by: Marland Kitchen MyChart Message (if you have MyChart) OR . A paper copy in the mail If you have any lab test that is abnormal or we need to change your treatment, we will call you to review the results.   Testing/Procedures: None ordered   Follow-Up: At [facility], you and your health needs are our priority.  As part of our continuing mission to provide you with exceptional heart care, we have created designated Provider Care Teams.  These Care Teams include your primary Cardiologist (physician) and Advanced Practice Providers (APPs -  Physician Assistants and Nurse Practitioners) who all work together to provide you with the care you need, when you need it.  We recommend signing up for the patient portal called "MyChart".  Sign up information is provided on this After Visit Summary.  MyChart is used to connect with patients for Virtual Visits (Telemedicine).  Patients are able to view lab/test results, encounter notes, upcoming appointments, etc.  Non-urgent messages can be sent to your provider as well.   To learn more about what you can do with MyChart, go to [email or web address].    Your next appointment:   6 week(s)  The format for your next appointment:   In Person  Provider:   Jyl Heinz, MD   Other Instructions NA

## 2020-03-31 ENCOUNTER — Telehealth: Payer: Self-pay | Admitting: Cardiology

## 2020-03-31 ENCOUNTER — Encounter: Payer: Self-pay | Admitting: Cardiology

## 2020-03-31 DIAGNOSIS — E876 Hypokalemia: Secondary | ICD-10-CM | POA: Diagnosis not present

## 2020-03-31 LAB — BASIC METABOLIC PANEL
BUN/Creatinine Ratio: 31 — ABNORMAL HIGH (ref 12–28)
BUN: 64 mg/dL — ABNORMAL HIGH (ref 8–27)
CO2: 29 mmol/L (ref 20–29)
Calcium: 9.8 mg/dL (ref 8.7–10.3)
Chloride: 88 mmol/L — ABNORMAL LOW (ref 96–106)
Creatinine, Ser: 2.07 mg/dL — ABNORMAL HIGH (ref 0.57–1.00)
GFR calc Af Amer: 27 mL/min/{1.73_m2} — ABNORMAL LOW (ref >59)
GFR calc non Af Amer: 23 mL/min/{1.73_m2} — ABNORMAL LOW (ref >59)
Glucose: 330 mg/dL — ABNORMAL HIGH (ref 65–99)
Potassium: 2.9 mmol/L — ABNORMAL LOW (ref 3.5–5.2)
Sodium: 136 mmol/L (ref 134–144)

## 2020-03-31 NOTE — Telephone Encounter (Signed)
New Message:     Please call asap, pt is there  Waiting to get lab

## 2020-03-31 NOTE — Telephone Encounter (Signed)
Patient is returning call to discuss lab results. 

## 2020-03-31 NOTE — Telephone Encounter (Signed)
Spoke to the patient just now and let her know that I am faxing the form for her labs over to [facility] at this time so that she can get this done. She verbalizes understanding and states that she will do it.

## 2020-03-31 NOTE — Telephone Encounter (Signed)
Results reviewed with pt as per Dr. Revankar's note.  Pt verbalized understanding and had no additional questions. Routed to PCP.  

## 2020-04-03 NOTE — Addendum Note (Signed)
Addended by: Truddie Hidden on: 04/03/2020 10:25 AM   Modules accepted: Orders

## 2020-04-03 NOTE — Telephone Encounter (Signed)
Results reviewed with pt as per Dr. Julien Nordmann note.  Pt verbalized understanding and had no additional questions. Routed to PCP. [facility] labs

## 2020-04-03 NOTE — Telephone Encounter (Signed)
Patient is calling back to over results with nurse. Please call back

## 2020-04-04 DIAGNOSIS — E876 Hypokalemia: Secondary | ICD-10-CM | POA: Diagnosis not present

## 2020-04-05 LAB — BASIC METABOLIC PANEL
BUN/Creatinine Ratio: 26 (ref 12–28)
BUN: 30 mg/dL — ABNORMAL HIGH (ref 8–27)
CO2: 30 mmol/L — ABNORMAL HIGH (ref 20–29)
Calcium: 9.9 mg/dL (ref 8.7–10.3)
Chloride: 92 mmol/L — ABNORMAL LOW (ref 96–106)
Creatinine, Ser: 1.16 mg/dL — ABNORMAL HIGH (ref 0.57–1.00)
GFR calc Af Amer: 54 mL/min/{1.73_m2} — ABNORMAL LOW (ref >59)
GFR calc non Af Amer: 46 mL/min/{1.73_m2} — ABNORMAL LOW (ref >59)
Glucose: 164 mg/dL — ABNORMAL HIGH (ref 65–99)
Potassium: 3.7 mmol/L (ref 3.5–5.2)
Sodium: 139 mmol/L (ref 134–144)

## 2020-04-17 DIAGNOSIS — N1832 Chronic kidney disease, stage 3b: Secondary | ICD-10-CM | POA: Diagnosis not present

## 2020-04-17 DIAGNOSIS — I70213 Atherosclerosis of native arteries of extremities with intermittent claudication, bilateral legs: Secondary | ICD-10-CM | POA: Diagnosis not present

## 2020-04-17 DIAGNOSIS — E785 Hyperlipidemia, unspecified: Secondary | ICD-10-CM | POA: Diagnosis not present

## 2020-04-17 DIAGNOSIS — I2510 Atherosclerotic heart disease of native coronary artery without angina pectoris: Secondary | ICD-10-CM | POA: Diagnosis not present

## 2020-04-17 DIAGNOSIS — Z79899 Other long term (current) drug therapy: Secondary | ICD-10-CM | POA: Diagnosis not present

## 2020-04-17 DIAGNOSIS — R7302 Impaired glucose tolerance (oral): Secondary | ICD-10-CM | POA: Diagnosis not present

## 2020-04-17 DIAGNOSIS — Z0000 Encounter for general adult medical examination without abnormal findings: Secondary | ICD-10-CM | POA: Diagnosis not present

## 2020-05-04 ENCOUNTER — Telehealth: Payer: Self-pay | Admitting: Cardiology

## 2020-05-04 NOTE — Telephone Encounter (Signed)
Garwin Brothers, MD  05/03/2020 4:08 PM EST      The results of the study is unremarkable. Please inform patient. I will discuss in detail at next appointment. [facility] primary care/referring physician Garwin Brothers, MD 05/03/2020 4:08 PM

## 2020-05-04 NOTE — Telephone Encounter (Signed)
I called and spoke with the patient regarding her heart monitor results as per Dr. Tomie China.   I have advised her to keep her follow up appointment on 05/11/20 with Dr. Nelwyn Salisbury. She also has a consult appt scheduled with Dr. Elberta Fortis on 05/15/20. I have advised Dr. Tomie China can let her know on 05/11/20 if she needs to keep the appointment with Dr. Elberta Fortis.  The patient voices understanding of the above and is agreeable.

## 2020-05-04 NOTE — Telephone Encounter (Signed)
    Pt is returning call for her heart monitor result   

## 2020-05-05 ENCOUNTER — Telehealth: Payer: Self-pay

## 2020-05-05 DIAGNOSIS — M79644 Pain in right finger(s): Secondary | ICD-10-CM | POA: Diagnosis not present

## 2020-05-05 DIAGNOSIS — M1811 Unilateral primary osteoarthritis of first carpometacarpal joint, right hand: Secondary | ICD-10-CM | POA: Diagnosis not present

## 2020-05-05 NOTE — Telephone Encounter (Signed)
Spoke with patient regarding results and recommendation.  Patient verbalizes understanding and is agreeable to plan of care. Advised patient to call back with any issues or concerns.  

## 2020-05-05 NOTE — Telephone Encounter (Signed)
-----   Message from [name], MD sent at 05/03/2020  4:08 PM EST ----- The results of the study is unremarkable. Please inform patient. I will discuss in detail at next appointment. Cc  primary care/referring physician [name], MD 05/03/2020 4:08 PM

## 2020-05-09 ENCOUNTER — Other Ambulatory Visit: Payer: Self-pay

## 2020-05-11 ENCOUNTER — Other Ambulatory Visit: Payer: Self-pay

## 2020-05-11 ENCOUNTER — Ambulatory Visit: Payer: Medicare Other | Admitting: Cardiology

## 2020-05-11 ENCOUNTER — Encounter: Payer: Self-pay | Admitting: Cardiology

## 2020-05-11 VITALS — BP 120/52 | HR 62 | Ht 60.0 in | Wt 165.0 lb

## 2020-05-11 DIAGNOSIS — I340 Nonrheumatic mitral (valve) insufficiency: Secondary | ICD-10-CM | POA: Diagnosis not present

## 2020-05-11 DIAGNOSIS — I10 Essential (primary) hypertension: Secondary | ICD-10-CM

## 2020-05-11 DIAGNOSIS — E782 Mixed hyperlipidemia: Secondary | ICD-10-CM

## 2020-05-11 NOTE — Patient Instructions (Signed)

## 2020-05-11 NOTE — Progress Notes (Signed)
Cardiology Office Note:    Date:  05/11/2020   ID:  KARYSA KASSAM, DOB 1946-04-29, MRN [id number]  PCP:  Street, Sharon Mt, MD  Cardiologist:  [name], MD   Referring MD: [address], [place] Mt, *    ASSESSMENT:    1. Essential hypertension   2. Mixed hyperlipidemia   3. Mitral valve insufficiency, unspecified etiology    PLAN:    In order of problems listed above:  1. Primary prevention stressed to the patient.  Importance of compliance with diet and medications stressed to the patient and patient  vocalized understanding. 2. Congestive heart failure: Salt intake issues were discussed diet was discussed she weighs herself on a regular basis and she is doing a good job with this.  She is also losing weight.  She will have Chem-7 done today. 3. Mixed dyslipidemia: Diet was emphasized and weight reduction was stressed. 4. Moderate mitral regurgitation: Her blood pressure is optimized and she is happy taking good care of herself.  Medical management at this time. 5. High-grade AV block during hospital admission.  Patient subsequently has had no issues.  She is not symptomatic.  No syncope dizziness or any such spells she has an appointment coming up with our electrophysiology colleagues. 6. Patient will be seen in follow-up appointment in 6 months or earlier if the patient has any concerns    Medication Adjustments/Labs and Tests Ordered: Current medicines are reviewed at length with the patient today.  Concerns regarding medicines are outlined above.  No orders of the defined types were placed in this encounter.  No orders of the defined types were placed in this encounter.    Chief Complaint  Patient presents with  . Follow-up     History of Present Illness:    DEILYN RAGATZ is a 75 y.o. female.  Patient has past medical history of essential hypertension dyslipidemia obesity and high-grade AV block while she was at the hospital.  She was also treated for  physical systolic function ejection failure.  She has done well.  She denies any problems at this time no chest pain orthopnea PND or syncope.  She has an appointment coming with our electrophysiology colleagues in the next few days.  At the time of my evaluation, the patient is alert awake oriented and in no distress.  Past Medical History:  Diagnosis Date  . AKI (acute kidney injury) ([place]) 05/20/2018  . Arthritis of foot, left 01/09/2016  . Baker's cyst of knee 02/06/2017  . Bilateral lower extremity edema 05/20/2018  . Chest pain in adult 04/25/2015   Overview:  Lexiscan  MPS with normal perfusion and function, EF 52%  . Chronic diastolic (congestive) heart failure ([place]) 05/20/2018   Echo - 04/2015 EF 0000000 Grade II diastolic dysfunction. Moderate mitral regurgitation  . Chronic systolic heart failure ([place])   . CKD (chronic kidney disease) stage 3, GFR 30-59 ml/min (HCC) 05/20/2018  . Closed dislocation of right elbow 04/24/2019   Added automatically from request for surgery [phone]  . Coronary artery disease due to lipid rich plaque 10/22/2018  . Degenerative arthritis of right foot 02/23/2020  . Epistaxis 03/24/2020  . Essential hypertension 04/26/2015  . Extensor tendonitis of foot 12/14/2015   Overview:  Left  . Finger pain, right 05/10/2019  . Hyperlipidemia 02/06/2017  . Injury of left foot 07/25/2015  . LBBB (left bundle branch block) 04/25/2015  . Migraine 02/06/2017  . Mitral regurgitation 12/17/2017  . Osteoarthritis 02/06/2017  . Osteoarthritis of  left foot 11/14/2015  . Painful orthopaedic hardware ([place]) 09/20/2019  . Pathological fracture of metatarsal bone of left foot with routine healing 08/15/2015  . Plantar fasciitis of left foot 06/03/2017  . Primary hypertension 04/26/2015  . RLS (restless legs syndrome) 04/26/2015  . Status post hardware removal 11/04/2019  . Symptomatic bradycardia 03/15/2020  . Transient complete heart block ([place]) 03/17/2020  . Venous ulcer of left leg  ([place]) 02/19/2017    Past Surgical History:  Procedure Laterality Date  . ABDOMINAL HYSTERECTOMY    . CHOLECYSTECTOMY    . FOOT FRACTURE SURGERY    . RIGHT/LEFT HEART CATH AND CORONARY ANGIOGRAPHY N/A 03/15/2020   Procedure: RIGHT/LEFT HEART CATH AND CORONARY ANGIOGRAPHY;  Surgeon: Jettie Booze, MD;  Location: [facility];  Service: Cardiovascular;  Laterality: N/A;  . TEE WITHOUT CARDIOVERSION N/A 03/17/2020   Procedure: TRANSESOPHAGEAL ECHOCARDIOGRAM (TEE);  Surgeon: Sanda Klein, MD;  Location[facility] ENDOSCOPY;  Service: Cardiovascular;  Laterality: N/A;  . TONSILLECTOMY    . VEIN SURGERY      Current Medications: Current Meds  Medication Sig  . albuterol (VENTOLIN HFA) 108 (90 Base) MCG/ACT inhaler Inhale 1-2 puffs into the lungs as needed for wheezing or shortness of breath.   Marland Kitchen amLODipine (NORVASC) 10 MG tablet Take 10 mg by mouth daily.  Marland Kitchen aspirin EC 81 MG tablet Take 1 tablet (81 mg total) by mouth daily.  Marland Kitchen atorvastatin (LIPITOR) 20 MG tablet TAKE 1 TABLET BY MOUTH EVERY DAY  . azithromycin (ZITHROMAX) 500 MG tablet Take 500 mg by mouth daily.  . bumetanide (BUMEX) 1 MG tablet Take 1 tablet by mouth daily.  . Calcium Carb-Cholecalciferol (CALCIUM-VITAMIN D3) 600-400 MG-UNIT TABS Take 2 tablets by mouth daily.  . diazepam (VALIUM) 10 MG tablet Take 10 mg by mouth See admin instructions. Take every other night for anxiety.  Marland Kitchen escitalopram (LEXAPRO) 5 MG tablet Take 5 mg by mouth daily.  Marland Kitchen esomeprazole (NEXIUM) 20 MG capsule Take 20 mg by mouth daily.   Marland Kitchen gabapentin (NEURONTIN) 300 MG capsule Take 300 mg by mouth at bedtime.   . hydrochlorothiazide (HYDRODIURIL) 25 MG tablet Take 25 mg by mouth daily.   . hydrOXYzine (ATARAX/VISTARIL) 25 MG tablet Take 25 mg by mouth as needed for anxiety.   Marland Kitchen ketoconazole (NIZORAL) 2 % cream Apply 1 application topically as needed.  . meloxicam (MOBIC) 15 MG tablet Take 15 mg by mouth daily.  . methocarbamol (ROBAXIN) 500 MG  tablet Take 1,000 mg by mouth daily as needed for muscle spasms (back spasms).   . nitroGLYCERIN (NITROSTAT) 0.4 MG SL tablet PLACE 1 TABLET (0.4 MG TOTAL) UNDER THE TONGUE EVERY 5 (FIVE) MINUTES AS NEEDED.  Marland Kitchen Potassium Chloride ER 20 MEQ TBCR Take 20 mEq by mouth daily.  Marland Kitchen rOPINIRole (REQUIP) 4 MG tablet Take 8 mg by mouth daily.   Marland Kitchen telmisartan (MICARDIS) 80 MG tablet Take 80 mg by mouth daily.  Marland Kitchen zolpidem (AMBIEN) 5 MG tablet Take 5 mg by mouth at bedtime as needed for sleep.   . [DISCONTINUED] methylPREDNISolone (MEDROL DOSEPAK) 4 MG TBPK tablet Take by mouth.     Allergies:   Lasix [furosemide] and Sulfa antibiotics   Social History   Socioeconomic History  . Marital status: Widowed    Spouse name: Not on file  . Number of children: 2  . Years of education: 22  . Highest education level: High school graduate  Occupational History  . Occupation: retired   Tobacco Use  .  Smoking status: Never Smoker  . Smokeless tobacco: Never Used  Vaping Use  . Vaping Use: Never used  Substance and Sexual Activity  . Alcohol use: No  . Drug use: No  . Sexual activity: Not on file  Other Topics Concern  . Not on file  Social History Narrative  . Not on file   Social Determinants of Health   Financial Resource Strain: Low Risk   . Difficulty of Paying Living Expenses: Not hard at all  Food Insecurity: No Food Insecurity  . Worried About Charity fundraiser in the Last Year: Never true  . Ran Out of Food in the Last Year: Never true  Transportation Needs: No Transportation Needs  . Lack of Transportation (Medical): No  . Lack of Transportation (Non-Medical): No  Physical Activity: Insufficiently Active  . Days of Exercise per Week: 3 days  . Minutes of Exercise per Session: 30 min  Stress: No Stress Concern Present  . Feeling of Stress : Not at all  Social Connections: Moderately Integrated  . Frequency of Communication with Friends and Family: More than three times a week  .  Frequency of Social Gatherings with Friends and Family: More than three times a week  . Attends Religious Services: More than 4 times per year  . Active Member of Clubs or Organizations: Yes  . Attends Archivist Meetings: More than 4 times per year  . Marital Status: Widowed     Family History: The patient's family history includes CAD in her brother; Cancer in her brother; Heart attack in her father and mother; Pulmonary embolism in her brother.  ROS:   Please see the history of present illness.    All other systems reviewed and are negative.  EKGs/Labs/Other Studies Reviewed:    The following studies were reviewed today: I discussed my findings with the patient .   Recent Labs: 06/16/2019: ALT 12 03/15/2020: Hemoglobin 12.8; Platelets 336 04/04/2020: BUN 30; Creatinine, Ser 1.16; Potassium 3.7; Sodium 139  Recent Lipid Panel    Component Value Date/Time   CHOL 144 02/10/2019 0816   TRIG 54 02/10/2019 0816   HDL 70 02/10/2019 0816   CHOLHDL 2.1 02/10/2019 0816   LDLCALC 62 02/10/2019 0816    Physical Exam:    VS:  BP (!) 120/52 (BP Location: Left Arm, Patient Position: Sitting, Cuff Size: Normal)   Pulse 62   Ht 5' (1.524 m)   Wt 165 lb (74.8 kg)   SpO2 98%   BMI 32.22 kg/m     Wt Readings from Last 3 Encounters:  05/11/20 165 lb (74.8 kg)  03/30/20 164 lb (74.4 kg)  03/17/20 164 lb 0.4 oz (74.4 kg)     GEN: Patient is in no acute distress HEENT: Normal NECK: No JVD; No carotid bruits LYMPHATICS: No lymphadenopathy CARDIAC: Hear sounds regular, 2/6 systolic murmur at the apex. RESPIRATORY:  Clear to auscultation without rales, wheezing or rhonchi  ABDOMEN: Soft, non-tender, non-distended MUSCULOSKELETAL:  No edema; No deformity  SKIN: Warm and dry NEUROLOGIC:  Alert and oriented x 3 PSYCHIATRIC:  Normal affect   Signed, [name], MD  05/11/2020 11:40 AM    [place]

## 2020-05-15 ENCOUNTER — Telehealth: Payer: Medicare Other | Admitting: Cardiology

## 2020-05-28 DIAGNOSIS — E785 Hyperlipidemia, unspecified: Secondary | ICD-10-CM | POA: Diagnosis not present

## 2020-05-28 DIAGNOSIS — M1990 Unspecified osteoarthritis, unspecified site: Secondary | ICD-10-CM | POA: Diagnosis not present

## 2020-07-05 ENCOUNTER — Other Ambulatory Visit: Payer: Self-pay | Admitting: Cardiology

## 2020-07-06 DIAGNOSIS — M069 Rheumatoid arthritis, unspecified: Secondary | ICD-10-CM | POA: Diagnosis not present

## 2020-07-21 DIAGNOSIS — I5032 Chronic diastolic (congestive) heart failure: Secondary | ICD-10-CM | POA: Diagnosis not present

## 2020-07-21 DIAGNOSIS — I2510 Atherosclerotic heart disease of native coronary artery without angina pectoris: Secondary | ICD-10-CM | POA: Diagnosis not present

## 2020-07-21 DIAGNOSIS — J431 Panlobular emphysema: Secondary | ICD-10-CM | POA: Diagnosis not present

## 2020-07-21 DIAGNOSIS — B354 Tinea corporis: Secondary | ICD-10-CM | POA: Diagnosis not present

## 2020-07-21 DIAGNOSIS — N1832 Chronic kidney disease, stage 3b: Secondary | ICD-10-CM | POA: Diagnosis not present

## 2020-07-21 DIAGNOSIS — I110 Hypertensive heart disease with heart failure: Secondary | ICD-10-CM | POA: Diagnosis not present

## 2020-07-21 DIAGNOSIS — L304 Erythema intertrigo: Secondary | ICD-10-CM | POA: Diagnosis not present

## 2020-07-21 DIAGNOSIS — G4700 Insomnia, unspecified: Secondary | ICD-10-CM | POA: Diagnosis not present

## 2020-07-21 DIAGNOSIS — I5030 Unspecified diastolic (congestive) heart failure: Secondary | ICD-10-CM | POA: Diagnosis not present

## 2020-07-21 DIAGNOSIS — I70213 Atherosclerosis of native arteries of extremities with intermittent claudication, bilateral legs: Secondary | ICD-10-CM | POA: Diagnosis not present

## 2020-07-21 DIAGNOSIS — M1990 Unspecified osteoarthritis, unspecified site: Secondary | ICD-10-CM | POA: Diagnosis not present

## 2020-08-08 ENCOUNTER — Other Ambulatory Visit: Payer: Self-pay

## 2020-08-08 ENCOUNTER — Ambulatory Visit: Payer: Medicare HMO | Admitting: Cardiology

## 2020-08-08 ENCOUNTER — Encounter: Payer: Self-pay | Admitting: Cardiology

## 2020-08-08 VITALS — BP 132/52 | HR 74 | Ht 60.0 in | Wt 162.0 lb

## 2020-08-08 DIAGNOSIS — I340 Nonrheumatic mitral (valve) insufficiency: Secondary | ICD-10-CM

## 2020-08-08 DIAGNOSIS — E782 Mixed hyperlipidemia: Secondary | ICD-10-CM

## 2020-08-08 DIAGNOSIS — I10 Essential (primary) hypertension: Secondary | ICD-10-CM

## 2020-08-08 DIAGNOSIS — I442 Atrioventricular block, complete: Secondary | ICD-10-CM

## 2020-08-08 NOTE — Progress Notes (Signed)
Cardiology Office Note:    Date:  08/08/2020   ID:  Kelsey Rivera, DOB 1945/09/29, MRN [id number]  PCP:  Street, Sharon Mt, MD  Cardiologist:  [name], MD   Referring MD: [address] Sharon Mt, *    ASSESSMENT:    1. Essential hypertension   2. Mixed hyperlipidemia   3. Transient complete heart block (HCC)   4. Mitral valve insufficiency, unspecified etiology    PLAN:    In order of problems listed above:  1. Primary prevention stressed with the patient.  Importance of compliance with diet medication stressed and she vocalized understanding.  I reviewed her blood work and she tells me that her blood work was done by primary care provider about a month ago and she was told that the blood work is fine. 2. Essential hypertension: Lifestyle modification urged blood pressure stable diet was emphasized. 3. Mixed dyslipidemia: On statin therapy and managed by primary care. 4. Mild to moderate mitral regurgitation: Stable and asymptomatic.  Medical management at this time.  Blood pressure stable. 5. Transient complete heart block: Asymptomatic.  No syncope dizziness or any spells.  This is resolved clinically. 6. Patient will be seen in follow-up appointment in 6 months or earlier if the patient has any concerns    Medication Adjustments/Labs and Tests Ordered: Current medicines are reviewed at length with the patient today.  Concerns regarding medicines are outlined above.  No orders of the defined types were placed in this encounter.  No orders of the defined types were placed in this encounter.    No chief complaint on file.    History of Present Illness:    Kelsey Rivera is a 75 y.o. female.  Patient has past medical history of essential hypertension, dyslipidemia, obesity and transient complete heart block.  She has mild to moderate mitral regurgitation.  She denies any problems at this time and takes care of activities of daily living.  No chest pain  orthopnea or PND.  At the time of my evaluation, the patient is alert awake oriented and in no distress.  Past Medical History:  Diagnosis Date  . AKI (acute kidney injury) ([place]) 05/20/2018  . Arthritis of foot, left 01/09/2016  . Baker's cyst of knee 02/06/2017  . Bilateral lower extremity edema 05/20/2018  . Chest pain in adult 04/25/2015   Overview:  Lexiscan  MPS with normal perfusion and function, EF 52%  . Chronic diastolic (congestive) heart failure ([place]) 05/20/2018   Echo - 04/2015 EF 79-15% Grade II diastolic dysfunction. Moderate mitral regurgitation  . Chronic systolic heart failure ([place])   . CKD (chronic kidney disease) stage 3, GFR 30-59 ml/min (HCC) 05/20/2018  . Closed dislocation of right elbow 04/24/2019   Added automatically from request for surgery [phone]  . Coronary artery disease due to lipid rich plaque 10/22/2018  . Degenerative arthritis of right foot 02/23/2020  . Epistaxis 03/24/2020  . Essential hypertension 04/26/2015  . Extensor tendonitis of foot 12/14/2015   Overview:  Left  . Finger pain, right 05/10/2019  . Hyperlipidemia 02/06/2017  . Injury of left foot 07/25/2015  . LBBB (left bundle branch block) 04/25/2015  . Migraine 02/06/2017  . Mitral regurgitation 12/17/2017  . Osteoarthritis 02/06/2017  . Osteoarthritis of left foot 11/14/2015  . Painful orthopaedic hardware ([place]) 09/20/2019  . Pathological fracture of metatarsal bone of left foot with routine healing 08/15/2015  . Plantar fasciitis of left foot 06/03/2017  . Primary hypertension 04/26/2015  . RLS (restless  legs syndrome) 04/26/2015  . Status post hardware removal 11/04/2019  . Symptomatic bradycardia 03/15/2020  . Transient complete heart block ([place]) 03/17/2020  . Venous ulcer of left leg ([place]) 02/19/2017    Past Surgical History:  Procedure Laterality Date  . ABDOMINAL HYSTERECTOMY    . CHOLECYSTECTOMY    . FOOT FRACTURE SURGERY    . RIGHT/LEFT HEART CATH AND CORONARY ANGIOGRAPHY N/A  03/15/2020   Procedure: RIGHT/LEFT HEART CATH AND CORONARY ANGIOGRAPHY;  Surgeon: Jettie Booze, MD;  Location: [facility];  Service: Cardiovascular;  Laterality: N/A;  . TEE WITHOUT CARDIOVERSION N/A 03/17/2020   Procedure: TRANSESOPHAGEAL ECHOCARDIOGRAM (TEE);  Surgeon: Sanda Klein, MD;  Location: [facility] ENDOSCOPY;  Service: Cardiovascular;  Laterality: N/A;  . TONSILLECTOMY    . VEIN SURGERY      Current Medications: Current Meds  Medication Sig  . albuterol (VENTOLIN HFA) 108 (90 Base) MCG/ACT inhaler Inhale 1-2 puffs into the lungs as needed for wheezing or shortness of breath.   Marland Kitchen amLODipine (NORVASC) 10 MG tablet Take 10 mg by mouth daily.  Marland Kitchen aspirin EC 81 MG tablet Take 1 tablet (81 mg total) by mouth daily.  Marland Kitchen atorvastatin (LIPITOR) 20 MG tablet Take 20 mg by mouth daily.  . bumetanide (BUMEX) 1 MG tablet Take 1 tablet by mouth daily.  . Calcium Carb-Cholecalciferol (CALCIUM-VITAMIN D3) 600-400 MG-UNIT TABS Take 2 tablets by mouth daily.  . diazepam (VALIUM) 10 MG tablet Take 10 mg by mouth every other day.  . escitalopram (LEXAPRO) 5 MG tablet Take 5 mg by mouth daily.  Marland Kitchen esomeprazole (NEXIUM) 20 MG capsule Take 20 mg by mouth daily.   Marland Kitchen gabapentin (NEURONTIN) 300 MG capsule Take 300 mg by mouth at bedtime.   . hydrochlorothiazide (HYDRODIURIL) 25 MG tablet Take 25 mg by mouth daily.   . hydrOXYzine (ATARAX/VISTARIL) 25 MG tablet Take 25 mg by mouth as needed for anxiety.   Marland Kitchen ketoconazole (NIZORAL) 2 % cream Apply 1 application topically as needed for rash.  . methocarbamol (ROBAXIN) 500 MG tablet Take 1,000 mg by mouth daily as needed for muscle spasms (back spasms).   . nitroGLYCERIN (NITROSTAT) 0.4 MG SL tablet Place 0.4 mg under the tongue every 5 (five) minutes as needed for chest pain.  Marland Kitchen Potassium Chloride ER 20 MEQ TBCR Take 20 mEq by mouth daily.  Marland Kitchen rOPINIRole (REQUIP) 4 MG tablet Take 8 mg by mouth daily.   Marland Kitchen telmisartan (MICARDIS) 80 MG tablet Take 80  mg by mouth daily.  Marland Kitchen zolpidem (AMBIEN) 5 MG tablet Take 5 mg by mouth at bedtime as needed for sleep.      Allergies:   Lasix [furosemide] and Sulfa antibiotics   Social History   Socioeconomic History  . Marital status: Widowed    Spouse name: Not on file  . Number of children: 2  . Years of education: 71  . Highest education level: High school graduate  Occupational History  . Occupation: retired   Tobacco Use  . Smoking status: Never Smoker  . Smokeless tobacco: Never Used  Vaping Use  . Vaping Use: Never used  Substance and Sexual Activity  . Alcohol use: No  . Drug use: No  . Sexual activity: Not on file  Other Topics Concern  . Not on file  Social History Narrative  . Not on file   Social Determinants of Health   Financial Resource Strain: Low Risk   . Difficulty of Paying Living Expenses: Not hard at all  Food Insecurity: No Food Insecurity  . Worried About Charity fundraiser in the Last Year: Never true  . Ran Out of Food in the Last Year: Never true  Transportation Needs: No Transportation Needs  . Lack of Transportation (Medical): No  . Lack of Transportation (Non-Medical): No  Physical Activity: Insufficiently Active  . Days of Exercise per Week: 3 days  . Minutes of Exercise per Session: 30 min  Stress: No Stress Concern Present  . Feeling of Stress : Not at all  Social Connections: Moderately Integrated  . Frequency of Communication with Friends and Family: More than three times a week  . Frequency of Social Gatherings with Friends and Family: More than three times a week  . Attends Religious Services: More than 4 times per year  . Active Member of Clubs or Organizations: Yes  . Attends Archivist Meetings: More than 4 times per year  . Marital Status: Widowed     Family History: The patient's family history includes CAD in her brother; Cancer in her brother; Heart attack in her father and mother; Pulmonary embolism in her  brother.  ROS:   Please see the history of present illness.    All other systems reviewed and are negative.  EKGs/Labs/Other Studies Reviewed:    The following studies were reviewed today: RIGHT/LEFT HEART CATH AND CORONARY ANGIOGRAPHY    Conclusion    There is mild left ventricular systolic dysfunction.  LV end diastolic pressure is normal.  The left ventricular ejection fraction is 45-50% by visual estimate.  There is no aortic valve stenosis.  Ao 100%, PA 71%, PA pressure 21/8, mean PA pressure 13 mm Hg; mean PCWP 7 mm Hg; CO 4.17 L/min; CI 2.43. Normal right heart pressures.  No significant CAD.   Consider EP eval given episodes of complete heart block with left bundle branch block.  Watch on tele.   Dr. [name] was also concerned about mitral regurgitation   . IMPRESSIONS    1. Left ventricular ejection fraction, by estimation, is 50 to 55%. The  left ventricle has low normal function. The left ventricle has no regional  wall motion abnormalities.  2. Right ventricular systolic function is normal. The right ventricular  size is normal.  3. No left atrial/left atrial appendage thrombus was detected.  4. Effective mitral regurgitant orifice area 0.22 cm sq, regurgitant  volume 38 ml. Vena contracta 0.22 cm. There is systolic dominant flow in  both the right and the left upper pulmonary veins.. The mitral valve is  normal in structure. Mild to moderate  mitral valve regurgitation. No evidence of mitral stenosis.  5. The aortic valve is normal in structure. Aortic valve regurgitation is  not visualized. No aortic stenosis is present.  6. There is mild (Grade II) protruding plaque involving the descending  aorta.  7. The inferior vena cava is normal in size with greater than 50%  respiratory variability, suggesting right atrial pressure of 3 mmHg.   Conclusion(s)/Recommendation(s): Normal biventricular function without  evidence of hemodynamically  significant valvular heart disease.              Recent Labs: 03/15/2020: Hemoglobin 12.8; Platelets 336 04/04/2020: BUN 30; Creatinine, Ser 1.16; Potassium 3.7; Sodium 139  Recent Lipid Panel    Component Value Date/Time   CHOL 144 02/10/2019 0816   TRIG 54 02/10/2019 0816   HDL 70 02/10/2019 0816   CHOLHDL 2.1 02/10/2019 0816   LDLCALC 62 02/10/2019 0816  Physical Exam:    VS:  BP (!) 132/52   Pulse 74   Ht 5' (1.524 m)   Wt 162 lb (73.5 kg)   SpO2 98%   BMI 31.64 kg/m     Wt Readings from Last 3 Encounters:  08/08/20 162 lb (73.5 kg)  05/11/20 165 lb (74.8 kg)  03/30/20 164 lb (74.4 kg)     GEN: Patient is in no acute distress HEENT: Normal NECK: No JVD; No carotid bruits LYMPHATICS: No lymphadenopathy CARDIAC: Hear sounds regular, 2/6 systolic murmur at the apex. RESPIRATORY:  Clear to auscultation without rales, wheezing or rhonchi  ABDOMEN: Soft, non-tender, non-distended MUSCULOSKELETAL:  No edema; No deformity  SKIN: Warm and dry NEUROLOGIC:  Alert and oriented x 3 PSYCHIATRIC:  Normal affect   Signed, [name], MD  08/08/2020 11:49 AM    [place]

## 2020-08-08 NOTE — Patient Instructions (Signed)

## 2020-08-18 DIAGNOSIS — L821 Other seborrheic keratosis: Secondary | ICD-10-CM | POA: Diagnosis not present

## 2020-08-18 DIAGNOSIS — C44712 Basal cell carcinoma of skin of right lower limb, including hip: Secondary | ICD-10-CM | POA: Diagnosis not present

## 2020-08-18 DIAGNOSIS — L820 Inflamed seborrheic keratosis: Secondary | ICD-10-CM | POA: Diagnosis not present

## 2020-08-18 DIAGNOSIS — L814 Other melanin hyperpigmentation: Secondary | ICD-10-CM | POA: Diagnosis not present

## 2020-08-18 DIAGNOSIS — L570 Actinic keratosis: Secondary | ICD-10-CM | POA: Diagnosis not present

## 2020-08-22 DIAGNOSIS — M2550 Pain in unspecified joint: Secondary | ICD-10-CM | POA: Diagnosis not present

## 2020-08-22 DIAGNOSIS — M19271 Secondary osteoarthritis, right ankle and foot: Secondary | ICD-10-CM | POA: Diagnosis not present

## 2020-08-22 DIAGNOSIS — M19272 Secondary osteoarthritis, left ankle and foot: Secondary | ICD-10-CM | POA: Diagnosis not present

## 2020-08-22 DIAGNOSIS — M19072 Primary osteoarthritis, left ankle and foot: Secondary | ICD-10-CM | POA: Diagnosis not present

## 2020-08-22 DIAGNOSIS — M19041 Primary osteoarthritis, right hand: Secondary | ICD-10-CM | POA: Diagnosis not present

## 2020-08-22 DIAGNOSIS — M19042 Primary osteoarthritis, left hand: Secondary | ICD-10-CM

## 2020-08-22 HISTORY — DX: Primary osteoarthritis, right hand: M19.042

## 2020-08-24 ENCOUNTER — Ambulatory Visit: Payer: Medicare Other | Admitting: Cardiology

## 2020-09-05 DIAGNOSIS — Z1159 Encounter for screening for other viral diseases: Secondary | ICD-10-CM | POA: Diagnosis not present

## 2020-09-05 DIAGNOSIS — Z20828 Contact with and (suspected) exposure to other viral communicable diseases: Secondary | ICD-10-CM | POA: Diagnosis not present

## 2020-10-14 DIAGNOSIS — L304 Erythema intertrigo: Secondary | ICD-10-CM | POA: Diagnosis not present

## 2020-10-27 DIAGNOSIS — S81812A Laceration without foreign body, left lower leg, initial encounter: Secondary | ICD-10-CM | POA: Diagnosis not present

## 2020-11-01 DIAGNOSIS — J431 Panlobular emphysema: Secondary | ICD-10-CM | POA: Diagnosis not present

## 2020-11-01 DIAGNOSIS — R1012 Left upper quadrant pain: Secondary | ICD-10-CM | POA: Diagnosis not present

## 2020-11-01 DIAGNOSIS — I5032 Chronic diastolic (congestive) heart failure: Secondary | ICD-10-CM | POA: Diagnosis not present

## 2020-11-01 DIAGNOSIS — I70213 Atherosclerosis of native arteries of extremities with intermittent claudication, bilateral legs: Secondary | ICD-10-CM | POA: Diagnosis not present

## 2020-11-01 DIAGNOSIS — I5030 Unspecified diastolic (congestive) heart failure: Secondary | ICD-10-CM | POA: Diagnosis not present

## 2020-11-01 DIAGNOSIS — S81812D Laceration without foreign body, left lower leg, subsequent encounter: Secondary | ICD-10-CM | POA: Diagnosis not present

## 2020-11-01 DIAGNOSIS — N1832 Chronic kidney disease, stage 3b: Secondary | ICD-10-CM | POA: Diagnosis not present

## 2020-11-01 DIAGNOSIS — I110 Hypertensive heart disease with heart failure: Secondary | ICD-10-CM | POA: Diagnosis not present

## 2020-11-01 DIAGNOSIS — I252 Old myocardial infarction: Secondary | ICD-10-CM | POA: Diagnosis not present

## 2020-11-04 DIAGNOSIS — L03116 Cellulitis of left lower limb: Secondary | ICD-10-CM | POA: Diagnosis not present

## 2020-11-04 DIAGNOSIS — S81812D Laceration without foreign body, left lower leg, subsequent encounter: Secondary | ICD-10-CM | POA: Diagnosis not present

## 2020-11-04 DIAGNOSIS — R109 Unspecified abdominal pain: Secondary | ICD-10-CM | POA: Diagnosis not present

## 2020-11-04 DIAGNOSIS — Z8709 Personal history of other diseases of the respiratory system: Secondary | ICD-10-CM | POA: Diagnosis not present

## 2020-11-04 DIAGNOSIS — M1990 Unspecified osteoarthritis, unspecified site: Secondary | ICD-10-CM | POA: Diagnosis not present

## 2020-11-04 DIAGNOSIS — I700 Atherosclerosis of aorta: Secondary | ICD-10-CM | POA: Diagnosis not present

## 2020-11-04 DIAGNOSIS — I509 Heart failure, unspecified: Secondary | ICD-10-CM | POA: Diagnosis not present

## 2020-11-04 DIAGNOSIS — I110 Hypertensive heart disease with heart failure: Secondary | ICD-10-CM | POA: Diagnosis not present

## 2020-11-04 DIAGNOSIS — K5730 Diverticulosis of large intestine without perforation or abscess without bleeding: Secondary | ICD-10-CM | POA: Diagnosis not present

## 2020-11-04 DIAGNOSIS — R1012 Left upper quadrant pain: Secondary | ICD-10-CM | POA: Diagnosis not present

## 2020-11-10 DIAGNOSIS — Z6830 Body mass index (BMI) 30.0-30.9, adult: Secondary | ICD-10-CM | POA: Diagnosis not present

## 2020-11-10 DIAGNOSIS — L03116 Cellulitis of left lower limb: Secondary | ICD-10-CM | POA: Diagnosis not present

## 2020-11-10 DIAGNOSIS — I700 Atherosclerosis of aorta: Secondary | ICD-10-CM | POA: Diagnosis not present

## 2020-11-10 DIAGNOSIS — S81812D Laceration without foreign body, left lower leg, subsequent encounter: Secondary | ICD-10-CM | POA: Diagnosis not present

## 2020-11-10 DIAGNOSIS — K5730 Diverticulosis of large intestine without perforation or abscess without bleeding: Secondary | ICD-10-CM | POA: Diagnosis not present

## 2020-11-10 DIAGNOSIS — E669 Obesity, unspecified: Secondary | ICD-10-CM | POA: Diagnosis not present

## 2020-11-10 DIAGNOSIS — R1012 Left upper quadrant pain: Secondary | ICD-10-CM | POA: Diagnosis not present

## 2020-11-10 DIAGNOSIS — E538 Deficiency of other specified B group vitamins: Secondary | ICD-10-CM | POA: Diagnosis not present

## 2020-11-11 IMAGING — DX DG CHEST 1V PORT
1 series · 1 of 1 positions shown · non-contrast
Comparison: 10/07/2018

CLINICAL DATA: Shortness of breath

EXAM:
PORTABLE CHEST 1 VIEW

[chest ap]
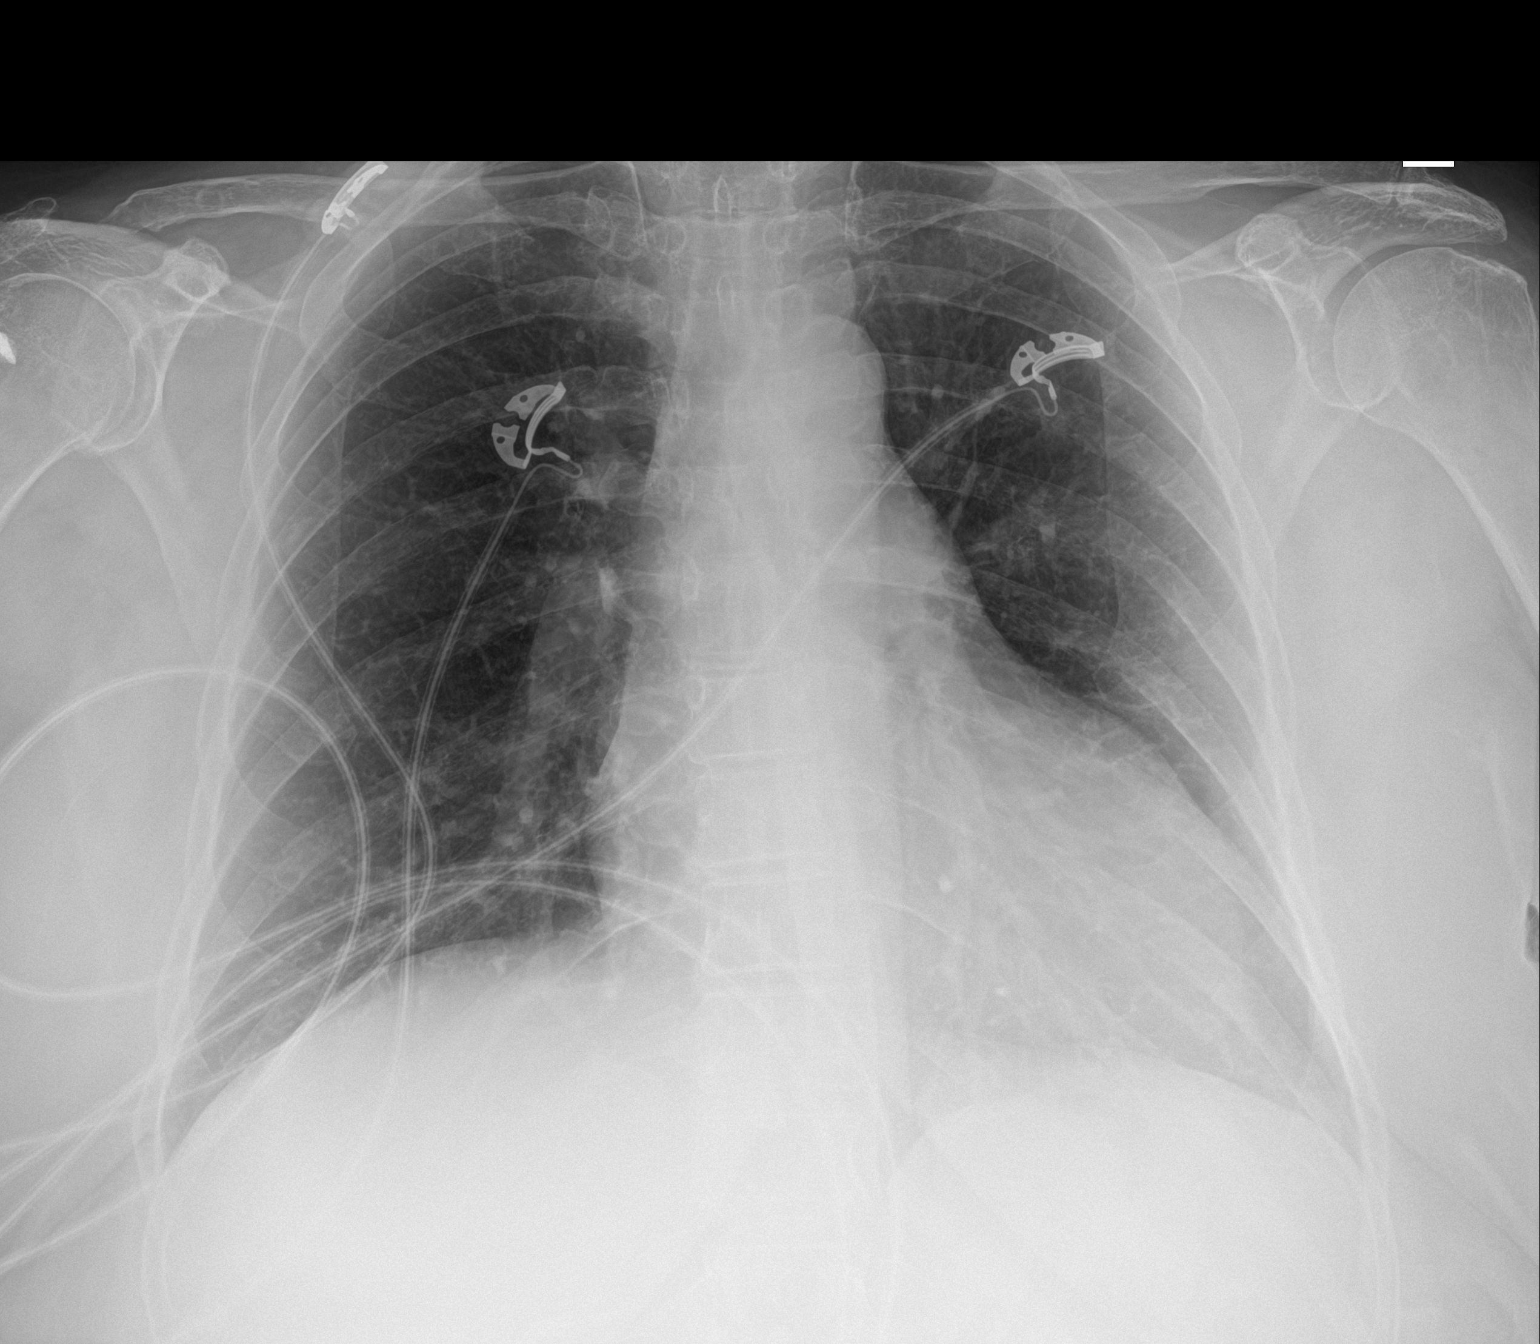

[1 of 1 positions shown; findings below may reference images not displayed]

FINDINGS: Heart is borderline in size. Lungs clear. No effusions or edema. No
acute bony abnormality.
IMPRESSION: No acute cardiopulmonary disease.

## 2020-11-13 DIAGNOSIS — J431 Panlobular emphysema: Secondary | ICD-10-CM | POA: Diagnosis not present

## 2020-11-13 DIAGNOSIS — R911 Solitary pulmonary nodule: Secondary | ICD-10-CM | POA: Diagnosis not present

## 2020-11-13 DIAGNOSIS — R918 Other nonspecific abnormal finding of lung field: Secondary | ICD-10-CM | POA: Diagnosis not present

## 2020-11-13 DIAGNOSIS — I700 Atherosclerosis of aorta: Secondary | ICD-10-CM | POA: Diagnosis not present

## 2020-11-13 DIAGNOSIS — R079 Chest pain, unspecified: Secondary | ICD-10-CM | POA: Diagnosis not present

## 2020-11-21 DIAGNOSIS — M19041 Primary osteoarthritis, right hand: Secondary | ICD-10-CM | POA: Diagnosis not present

## 2020-11-21 DIAGNOSIS — M19042 Primary osteoarthritis, left hand: Secondary | ICD-10-CM | POA: Diagnosis not present

## 2020-11-21 DIAGNOSIS — M2550 Pain in unspecified joint: Secondary | ICD-10-CM | POA: Diagnosis not present

## 2020-11-21 DIAGNOSIS — M19271 Secondary osteoarthritis, right ankle and foot: Secondary | ICD-10-CM | POA: Diagnosis not present

## 2020-11-21 IMAGING — CR DG CHEST 2V
2 series · 2 of 2 positions shown · non-contrast
Comparison: 06/05/2019

CLINICAL DATA: Chest pain

EXAM:
CHEST - 2 VIEW

[chest lat]
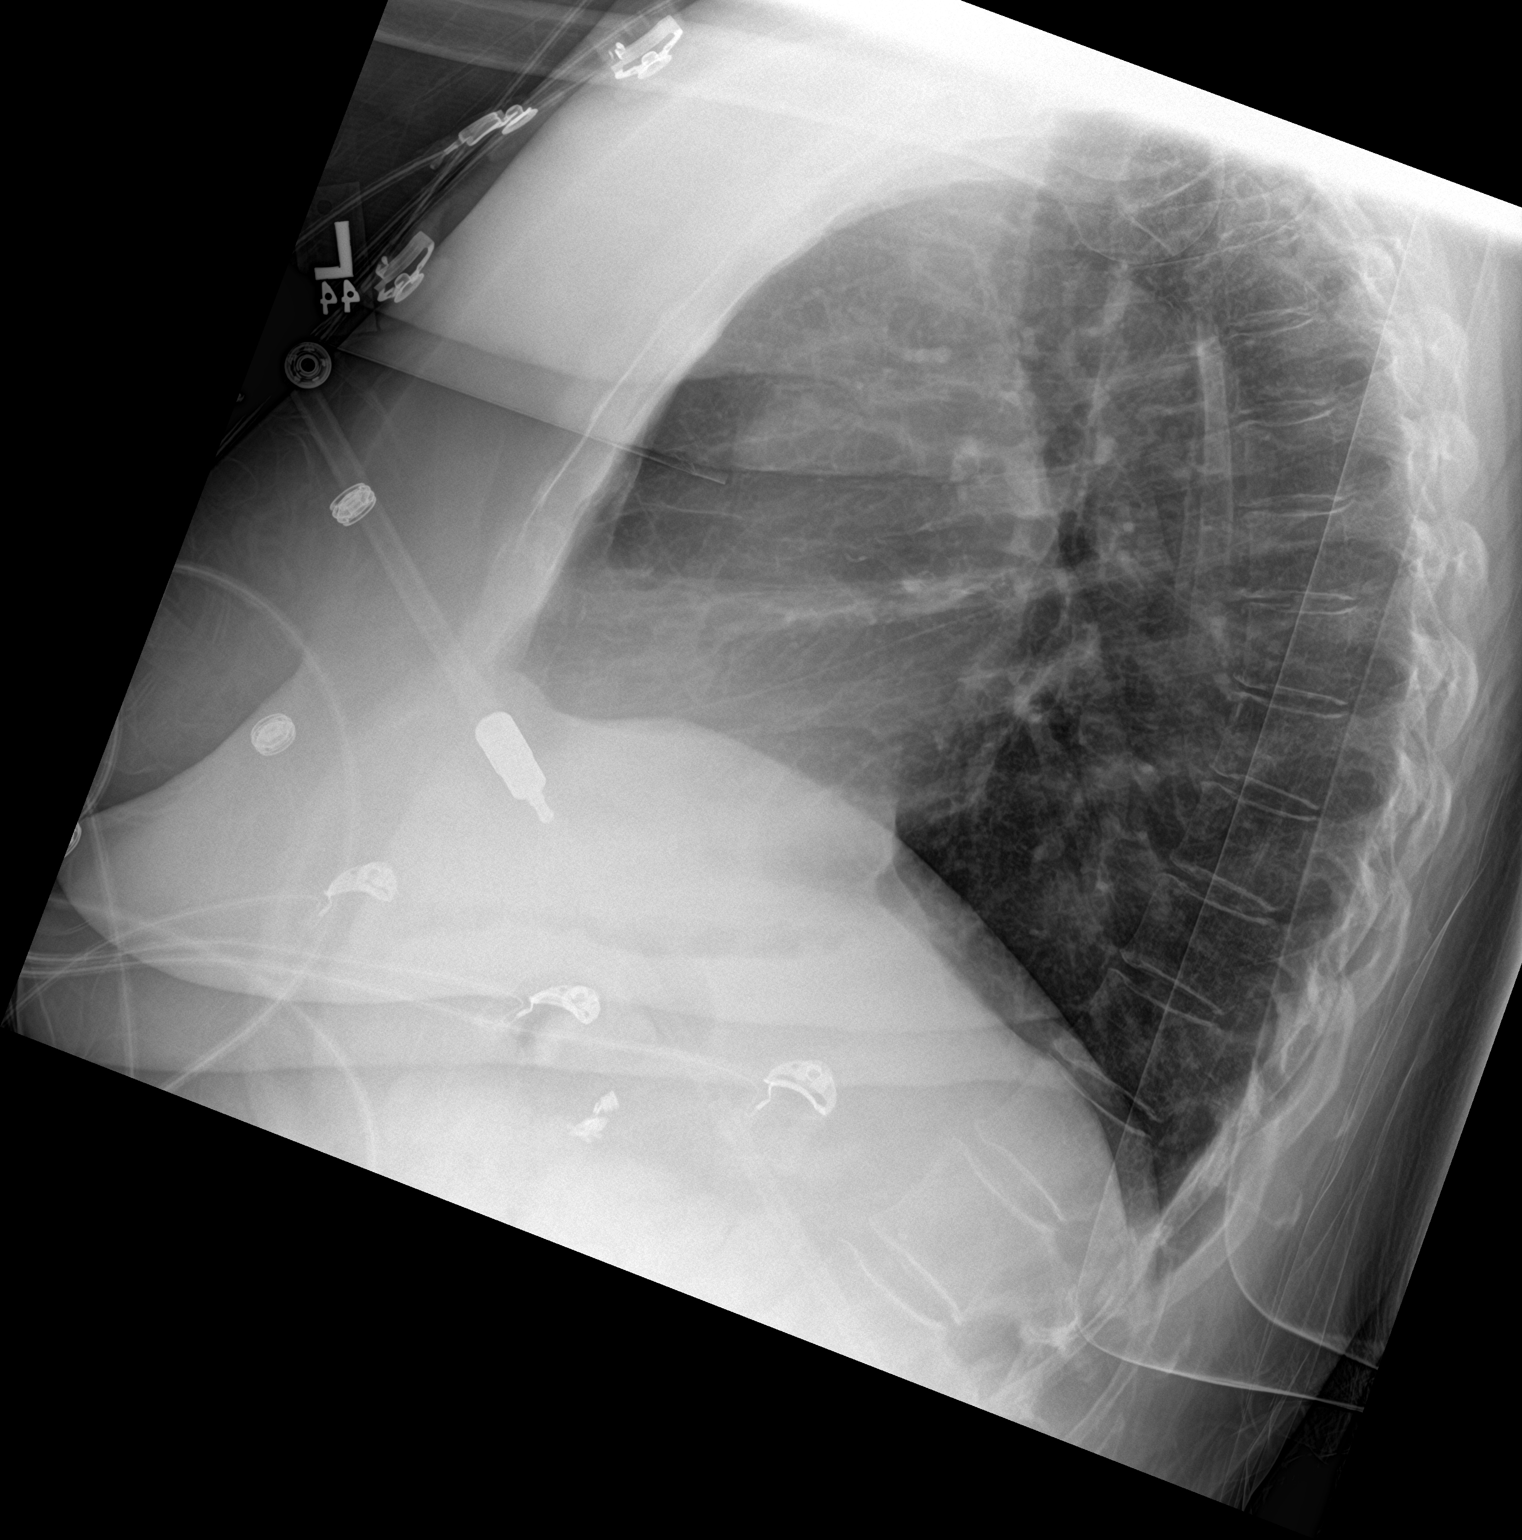

[chest ap]
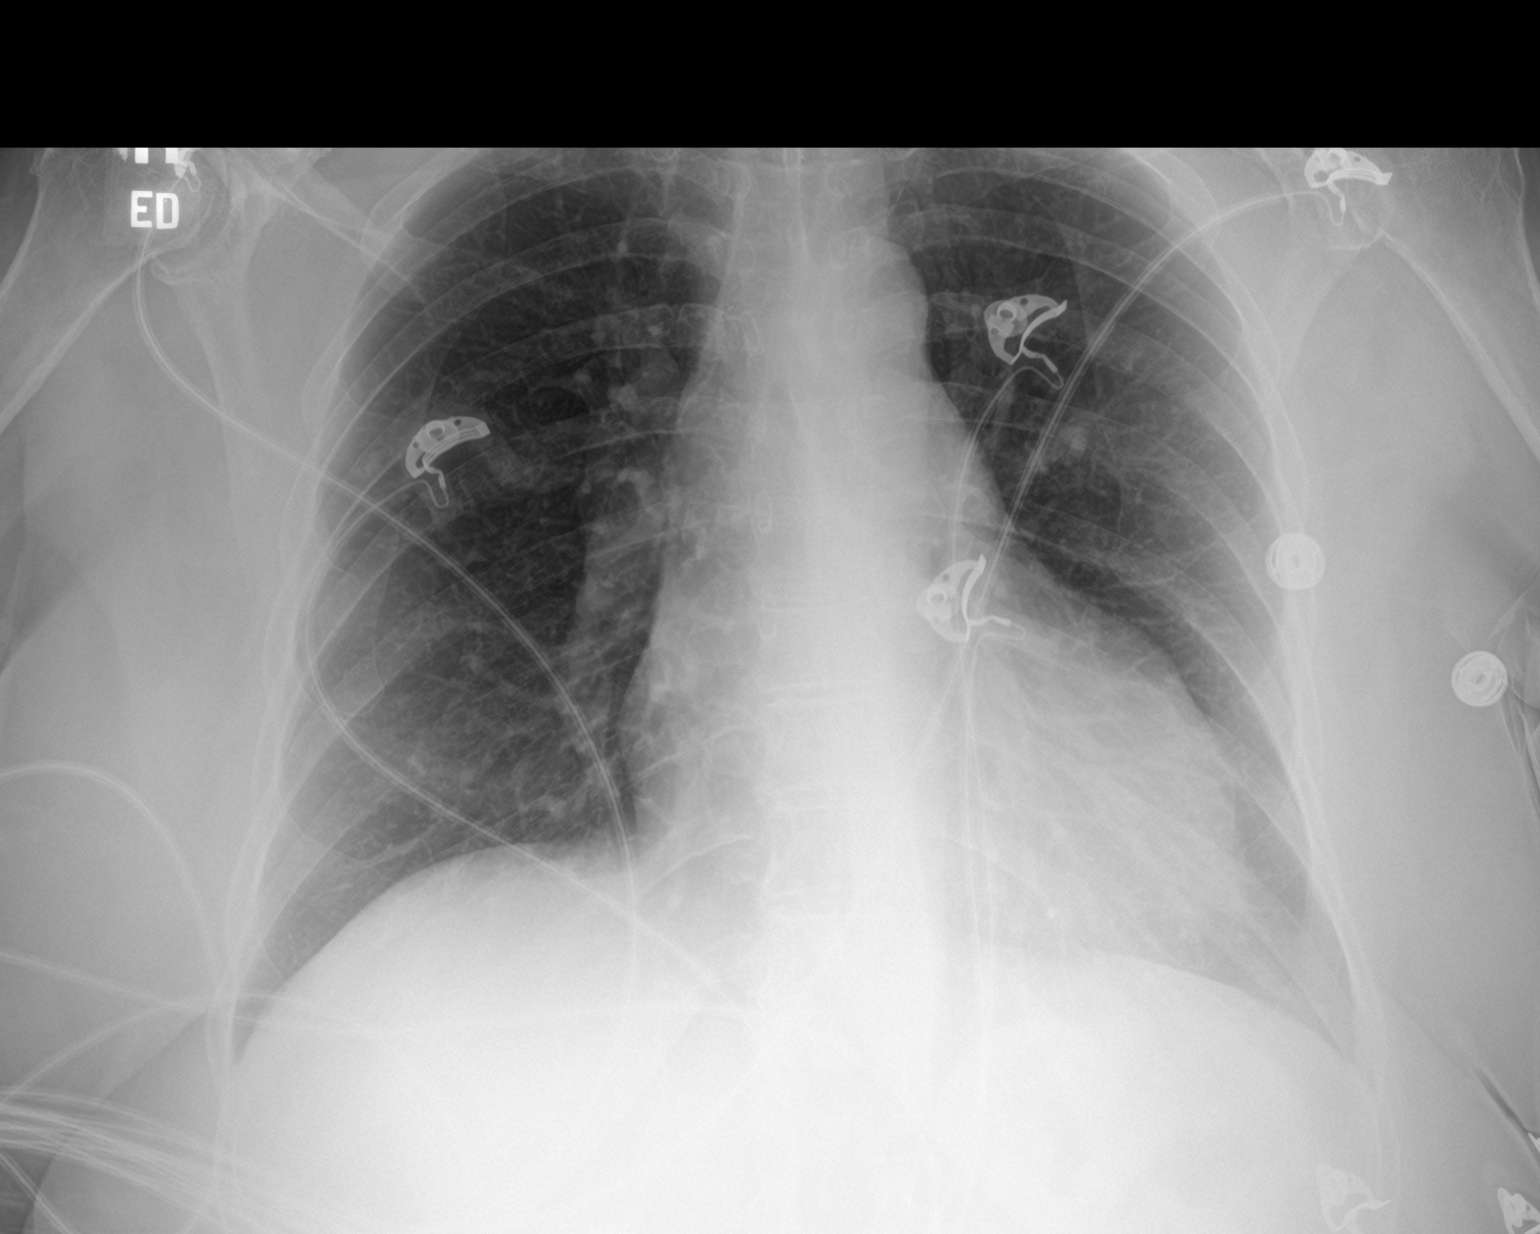

[2 of 2 positions shown; findings below may reference images not displayed]

FINDINGS: Mild cardiomegaly. No focal airspace disease, pleural effusion, or
pneumothorax.
IMPRESSION: No active cardiopulmonary disease.  Mild cardiomegaly.

## 2020-12-26 DIAGNOSIS — R059 Cough, unspecified: Secondary | ICD-10-CM | POA: Diagnosis not present

## 2020-12-26 DIAGNOSIS — J069 Acute upper respiratory infection, unspecified: Secondary | ICD-10-CM | POA: Diagnosis not present

## 2020-12-26 DIAGNOSIS — K591 Functional diarrhea: Secondary | ICD-10-CM | POA: Diagnosis not present

## 2020-12-26 DIAGNOSIS — R519 Headache, unspecified: Secondary | ICD-10-CM | POA: Diagnosis not present

## 2021-01-14 ENCOUNTER — Other Ambulatory Visit: Payer: Self-pay | Admitting: Cardiology

## 2021-01-30 DIAGNOSIS — C44629 Squamous cell carcinoma of skin of left upper limb, including shoulder: Secondary | ICD-10-CM | POA: Diagnosis not present

## 2021-01-30 DIAGNOSIS — L570 Actinic keratosis: Secondary | ICD-10-CM | POA: Diagnosis not present

## 2021-02-14 DIAGNOSIS — S069X9A Unspecified intracranial injury with loss of consciousness of unspecified duration, initial encounter: Secondary | ICD-10-CM | POA: Diagnosis not present

## 2021-02-14 DIAGNOSIS — S2020XA Contusion of thorax, unspecified, initial encounter: Secondary | ICD-10-CM | POA: Diagnosis not present

## 2021-02-14 DIAGNOSIS — J019 Acute sinusitis, unspecified: Secondary | ICD-10-CM | POA: Diagnosis not present

## 2021-02-18 DIAGNOSIS — Z79899 Other long term (current) drug therapy: Secondary | ICD-10-CM | POA: Diagnosis not present

## 2021-02-18 DIAGNOSIS — M79602 Pain in left arm: Secondary | ICD-10-CM | POA: Diagnosis not present

## 2021-02-18 DIAGNOSIS — R0602 Shortness of breath: Secondary | ICD-10-CM | POA: Diagnosis not present

## 2021-02-18 DIAGNOSIS — J449 Chronic obstructive pulmonary disease, unspecified: Secondary | ICD-10-CM | POA: Diagnosis not present

## 2021-02-18 DIAGNOSIS — S0512XA Contusion of eyeball and orbital tissues, left eye, initial encounter: Secondary | ICD-10-CM | POA: Diagnosis not present

## 2021-02-18 DIAGNOSIS — I10 Essential (primary) hypertension: Secondary | ICD-10-CM | POA: Diagnosis not present

## 2021-02-18 DIAGNOSIS — R071 Chest pain on breathing: Secondary | ICD-10-CM | POA: Diagnosis not present

## 2021-02-18 DIAGNOSIS — E785 Hyperlipidemia, unspecified: Secondary | ICD-10-CM | POA: Diagnosis not present

## 2021-02-18 DIAGNOSIS — R0789 Other chest pain: Secondary | ICD-10-CM | POA: Diagnosis not present

## 2021-02-18 DIAGNOSIS — R509 Fever, unspecified: Secondary | ICD-10-CM | POA: Diagnosis not present

## 2021-02-18 DIAGNOSIS — R079 Chest pain, unspecified: Secondary | ICD-10-CM | POA: Diagnosis not present

## 2021-02-18 DIAGNOSIS — R9431 Abnormal electrocardiogram [ECG] [EKG]: Secondary | ICD-10-CM | POA: Diagnosis not present

## 2021-02-21 DIAGNOSIS — R0789 Other chest pain: Secondary | ICD-10-CM | POA: Diagnosis not present

## 2021-02-23 DIAGNOSIS — Z23 Encounter for immunization: Secondary | ICD-10-CM | POA: Diagnosis not present

## 2021-02-26 DIAGNOSIS — I2510 Atherosclerotic heart disease of native coronary artery without angina pectoris: Secondary | ICD-10-CM | POA: Diagnosis not present

## 2021-02-26 DIAGNOSIS — E538 Deficiency of other specified B group vitamins: Secondary | ICD-10-CM | POA: Diagnosis not present

## 2021-02-26 DIAGNOSIS — N1832 Chronic kidney disease, stage 3b: Secondary | ICD-10-CM | POA: Diagnosis not present

## 2021-03-01 DIAGNOSIS — G2581 Restless legs syndrome: Secondary | ICD-10-CM | POA: Diagnosis not present

## 2021-03-01 DIAGNOSIS — S0990XS Unspecified injury of head, sequela: Secondary | ICD-10-CM | POA: Diagnosis not present

## 2021-03-01 DIAGNOSIS — I5030 Unspecified diastolic (congestive) heart failure: Secondary | ICD-10-CM | POA: Diagnosis not present

## 2021-03-01 DIAGNOSIS — J0140 Acute pansinusitis, unspecified: Secondary | ICD-10-CM | POA: Diagnosis not present

## 2021-03-01 DIAGNOSIS — S20212A Contusion of left front wall of thorax, initial encounter: Secondary | ICD-10-CM | POA: Diagnosis not present

## 2021-03-01 DIAGNOSIS — Z6830 Body mass index (BMI) 30.0-30.9, adult: Secondary | ICD-10-CM | POA: Diagnosis not present

## 2021-03-01 DIAGNOSIS — I110 Hypertensive heart disease with heart failure: Secondary | ICD-10-CM | POA: Diagnosis not present

## 2021-03-28 DIAGNOSIS — R252 Cramp and spasm: Secondary | ICD-10-CM | POA: Diagnosis not present

## 2021-03-28 DIAGNOSIS — Z6830 Body mass index (BMI) 30.0-30.9, adult: Secondary | ICD-10-CM | POA: Diagnosis not present

## 2021-03-28 DIAGNOSIS — J431 Panlobular emphysema: Secondary | ICD-10-CM | POA: Diagnosis not present

## 2021-03-28 DIAGNOSIS — N3281 Overactive bladder: Secondary | ICD-10-CM | POA: Diagnosis not present

## 2021-04-23 DIAGNOSIS — S7001XA Contusion of right hip, initial encounter: Secondary | ICD-10-CM | POA: Diagnosis not present

## 2021-04-23 DIAGNOSIS — I447 Left bundle-branch block, unspecified: Secondary | ICD-10-CM | POA: Diagnosis not present

## 2021-04-23 DIAGNOSIS — R0600 Dyspnea, unspecified: Secondary | ICD-10-CM | POA: Diagnosis not present

## 2021-04-23 DIAGNOSIS — I10 Essential (primary) hypertension: Secondary | ICD-10-CM | POA: Diagnosis not present

## 2021-04-23 DIAGNOSIS — R0789 Other chest pain: Secondary | ICD-10-CM | POA: Diagnosis not present

## 2021-04-23 DIAGNOSIS — R0602 Shortness of breath: Secondary | ICD-10-CM | POA: Diagnosis not present

## 2021-04-23 DIAGNOSIS — I272 Pulmonary hypertension, unspecified: Secondary | ICD-10-CM | POA: Diagnosis not present

## 2021-04-23 DIAGNOSIS — F418 Other specified anxiety disorders: Secondary | ICD-10-CM | POA: Diagnosis not present

## 2021-04-23 DIAGNOSIS — I5030 Unspecified diastolic (congestive) heart failure: Secondary | ICD-10-CM | POA: Diagnosis not present

## 2021-04-23 DIAGNOSIS — G4700 Insomnia, unspecified: Secondary | ICD-10-CM | POA: Diagnosis not present

## 2021-04-23 DIAGNOSIS — Z20828 Contact with and (suspected) exposure to other viral communicable diseases: Secondary | ICD-10-CM | POA: Diagnosis not present

## 2021-04-23 DIAGNOSIS — M19041 Primary osteoarthritis, right hand: Secondary | ICD-10-CM | POA: Diagnosis not present

## 2021-04-23 DIAGNOSIS — M159 Polyosteoarthritis, unspecified: Secondary | ICD-10-CM | POA: Diagnosis not present

## 2021-04-23 DIAGNOSIS — R079 Chest pain, unspecified: Secondary | ICD-10-CM | POA: Diagnosis not present

## 2021-04-23 DIAGNOSIS — R509 Fever, unspecified: Secondary | ICD-10-CM | POA: Diagnosis not present

## 2021-04-23 DIAGNOSIS — R778 Other specified abnormalities of plasma proteins: Secondary | ICD-10-CM | POA: Diagnosis not present

## 2021-04-23 DIAGNOSIS — I5023 Acute on chronic systolic (congestive) heart failure: Secondary | ICD-10-CM | POA: Diagnosis not present

## 2021-04-23 DIAGNOSIS — M19042 Primary osteoarthritis, left hand: Secondary | ICD-10-CM | POA: Diagnosis not present

## 2021-04-23 DIAGNOSIS — F419 Anxiety disorder, unspecified: Secondary | ICD-10-CM | POA: Diagnosis not present

## 2021-04-23 DIAGNOSIS — G2581 Restless legs syndrome: Secondary | ICD-10-CM | POA: Diagnosis not present

## 2021-04-23 DIAGNOSIS — I110 Hypertensive heart disease with heart failure: Secondary | ICD-10-CM | POA: Diagnosis not present

## 2021-04-23 DIAGNOSIS — R531 Weakness: Secondary | ICD-10-CM | POA: Diagnosis not present

## 2021-04-23 DIAGNOSIS — R2241 Localized swelling, mass and lump, right lower limb: Secondary | ICD-10-CM | POA: Diagnosis not present

## 2021-04-24 DIAGNOSIS — G2581 Restless legs syndrome: Secondary | ICD-10-CM | POA: Diagnosis not present

## 2021-04-24 DIAGNOSIS — G4700 Insomnia, unspecified: Secondary | ICD-10-CM | POA: Diagnosis not present

## 2021-04-24 DIAGNOSIS — E669 Obesity, unspecified: Secondary | ICD-10-CM | POA: Diagnosis not present

## 2021-04-24 DIAGNOSIS — S7001XA Contusion of right hip, initial encounter: Secondary | ICD-10-CM | POA: Diagnosis not present

## 2021-04-24 DIAGNOSIS — R0602 Shortness of breath: Secondary | ICD-10-CM | POA: Diagnosis not present

## 2021-04-24 DIAGNOSIS — I110 Hypertensive heart disease with heart failure: Secondary | ICD-10-CM | POA: Diagnosis not present

## 2021-04-24 DIAGNOSIS — M1990 Unspecified osteoarthritis, unspecified site: Secondary | ICD-10-CM | POA: Diagnosis not present

## 2021-04-24 DIAGNOSIS — I5023 Acute on chronic systolic (congestive) heart failure: Secondary | ICD-10-CM | POA: Diagnosis not present

## 2021-04-24 DIAGNOSIS — F418 Other specified anxiety disorders: Secondary | ICD-10-CM | POA: Diagnosis not present

## 2021-04-24 DIAGNOSIS — R778 Other specified abnormalities of plasma proteins: Secondary | ICD-10-CM | POA: Diagnosis not present

## 2021-04-24 DIAGNOSIS — I447 Left bundle-branch block, unspecified: Secondary | ICD-10-CM | POA: Diagnosis not present

## 2021-04-27 DIAGNOSIS — M19072 Primary osteoarthritis, left ankle and foot: Secondary | ICD-10-CM | POA: Diagnosis not present

## 2021-04-27 DIAGNOSIS — G2581 Restless legs syndrome: Secondary | ICD-10-CM | POA: Diagnosis not present

## 2021-04-27 DIAGNOSIS — I5032 Chronic diastolic (congestive) heart failure: Secondary | ICD-10-CM | POA: Diagnosis not present

## 2021-04-27 DIAGNOSIS — M19071 Primary osteoarthritis, right ankle and foot: Secondary | ICD-10-CM | POA: Diagnosis not present

## 2021-05-03 DIAGNOSIS — I5030 Unspecified diastolic (congestive) heart failure: Secondary | ICD-10-CM | POA: Diagnosis not present

## 2021-05-03 DIAGNOSIS — I5020 Unspecified systolic (congestive) heart failure: Secondary | ICD-10-CM | POA: Diagnosis not present

## 2021-05-03 DIAGNOSIS — I70213 Atherosclerosis of native arteries of extremities with intermittent claudication, bilateral legs: Secondary | ICD-10-CM | POA: Diagnosis not present

## 2021-05-03 DIAGNOSIS — I340 Nonrheumatic mitral (valve) insufficiency: Secondary | ICD-10-CM | POA: Diagnosis not present

## 2021-05-03 DIAGNOSIS — G720 Drug-induced myopathy: Secondary | ICD-10-CM | POA: Diagnosis not present

## 2021-05-03 DIAGNOSIS — T466X5A Adverse effect of antihyperlipidemic and antiarteriosclerotic drugs, initial encounter: Secondary | ICD-10-CM | POA: Diagnosis not present

## 2021-05-03 DIAGNOSIS — Z0000 Encounter for general adult medical examination without abnormal findings: Secondary | ICD-10-CM | POA: Diagnosis not present

## 2021-05-03 DIAGNOSIS — I110 Hypertensive heart disease with heart failure: Secondary | ICD-10-CM | POA: Diagnosis not present

## 2021-05-03 DIAGNOSIS — E785 Hyperlipidemia, unspecified: Secondary | ICD-10-CM | POA: Diagnosis not present

## 2021-05-10 DIAGNOSIS — E785 Hyperlipidemia, unspecified: Secondary | ICD-10-CM | POA: Diagnosis not present

## 2021-05-10 DIAGNOSIS — N1832 Chronic kidney disease, stage 3b: Secondary | ICD-10-CM | POA: Diagnosis not present

## 2021-05-10 DIAGNOSIS — R7301 Impaired fasting glucose: Secondary | ICD-10-CM | POA: Diagnosis not present

## 2021-05-10 DIAGNOSIS — Z780 Asymptomatic menopausal state: Secondary | ICD-10-CM | POA: Diagnosis not present

## 2021-05-10 DIAGNOSIS — Z79899 Other long term (current) drug therapy: Secondary | ICD-10-CM | POA: Diagnosis not present

## 2021-05-15 DIAGNOSIS — H5711 Ocular pain, right eye: Secondary | ICD-10-CM | POA: Diagnosis not present

## 2021-05-15 DIAGNOSIS — M25521 Pain in right elbow: Secondary | ICD-10-CM | POA: Diagnosis not present

## 2021-05-15 DIAGNOSIS — Z9181 History of falling: Secondary | ICD-10-CM | POA: Diagnosis not present

## 2021-05-15 DIAGNOSIS — Z6829 Body mass index (BMI) 29.0-29.9, adult: Secondary | ICD-10-CM | POA: Diagnosis not present

## 2021-05-15 DIAGNOSIS — M25551 Pain in right hip: Secondary | ICD-10-CM | POA: Diagnosis not present

## 2021-05-16 DIAGNOSIS — S0990XA Unspecified injury of head, initial encounter: Secondary | ICD-10-CM | POA: Diagnosis not present

## 2021-05-16 DIAGNOSIS — R42 Dizziness and giddiness: Secondary | ICD-10-CM | POA: Diagnosis not present

## 2021-05-16 DIAGNOSIS — H5711 Ocular pain, right eye: Secondary | ICD-10-CM | POA: Diagnosis not present

## 2021-05-16 DIAGNOSIS — R410 Disorientation, unspecified: Secondary | ICD-10-CM | POA: Diagnosis not present

## 2021-05-21 ENCOUNTER — Other Ambulatory Visit: Payer: Self-pay | Admitting: Cardiology

## 2021-07-09 DIAGNOSIS — M19041 Primary osteoarthritis, right hand: Secondary | ICD-10-CM | POA: Diagnosis not present

## 2021-07-09 DIAGNOSIS — M19072 Primary osteoarthritis, left ankle and foot: Secondary | ICD-10-CM | POA: Diagnosis not present

## 2021-07-09 DIAGNOSIS — M19042 Primary osteoarthritis, left hand: Secondary | ICD-10-CM | POA: Diagnosis not present

## 2021-07-09 DIAGNOSIS — M19272 Secondary osteoarthritis, left ankle and foot: Secondary | ICD-10-CM | POA: Diagnosis not present

## 2021-08-06 DIAGNOSIS — M25552 Pain in left hip: Secondary | ICD-10-CM | POA: Diagnosis not present

## 2021-08-06 DIAGNOSIS — Z6827 Body mass index (BMI) 27.0-27.9, adult: Secondary | ICD-10-CM | POA: Diagnosis not present

## 2021-08-06 DIAGNOSIS — M62830 Muscle spasm of back: Secondary | ICD-10-CM | POA: Diagnosis not present

## 2021-08-09 DIAGNOSIS — M76892 Other specified enthesopathies of left lower limb, excluding foot: Secondary | ICD-10-CM | POA: Diagnosis not present

## 2021-08-27 DIAGNOSIS — M25552 Pain in left hip: Secondary | ICD-10-CM | POA: Diagnosis not present

## 2021-08-27 DIAGNOSIS — M76892 Other specified enthesopathies of left lower limb, excluding foot: Secondary | ICD-10-CM | POA: Diagnosis not present

## 2021-08-30 DIAGNOSIS — M76892 Other specified enthesopathies of left lower limb, excluding foot: Secondary | ICD-10-CM | POA: Diagnosis not present

## 2021-08-30 DIAGNOSIS — M25552 Pain in left hip: Secondary | ICD-10-CM | POA: Diagnosis not present

## 2021-09-05 DIAGNOSIS — M76892 Other specified enthesopathies of left lower limb, excluding foot: Secondary | ICD-10-CM | POA: Diagnosis not present

## 2021-09-05 DIAGNOSIS — M25552 Pain in left hip: Secondary | ICD-10-CM | POA: Diagnosis not present

## 2021-09-07 DIAGNOSIS — M76892 Other specified enthesopathies of left lower limb, excluding foot: Secondary | ICD-10-CM | POA: Diagnosis not present

## 2021-09-07 DIAGNOSIS — M25552 Pain in left hip: Secondary | ICD-10-CM | POA: Diagnosis not present

## 2021-09-20 DIAGNOSIS — M7062 Trochanteric bursitis, left hip: Secondary | ICD-10-CM | POA: Diagnosis not present

## 2021-09-27 DIAGNOSIS — L570 Actinic keratosis: Secondary | ICD-10-CM | POA: Diagnosis not present

## 2021-10-26 DIAGNOSIS — N1832 Chronic kidney disease, stage 3b: Secondary | ICD-10-CM | POA: Diagnosis not present

## 2021-10-26 DIAGNOSIS — I2510 Atherosclerotic heart disease of native coronary artery without angina pectoris: Secondary | ICD-10-CM | POA: Diagnosis not present

## 2021-10-26 DIAGNOSIS — E538 Deficiency of other specified B group vitamins: Secondary | ICD-10-CM | POA: Diagnosis not present

## 2021-11-07 DIAGNOSIS — J40 Bronchitis, not specified as acute or chronic: Secondary | ICD-10-CM | POA: Diagnosis not present

## 2021-11-07 DIAGNOSIS — J329 Chronic sinusitis, unspecified: Secondary | ICD-10-CM | POA: Diagnosis not present

## 2021-11-07 DIAGNOSIS — Z6827 Body mass index (BMI) 27.0-27.9, adult: Secondary | ICD-10-CM | POA: Diagnosis not present

## 2021-11-07 DIAGNOSIS — J431 Panlobular emphysema: Secondary | ICD-10-CM | POA: Diagnosis not present

## 2021-11-12 DIAGNOSIS — I5020 Unspecified systolic (congestive) heart failure: Secondary | ICD-10-CM | POA: Diagnosis not present

## 2021-11-12 DIAGNOSIS — J441 Chronic obstructive pulmonary disease with (acute) exacerbation: Secondary | ICD-10-CM | POA: Diagnosis not present

## 2021-11-12 DIAGNOSIS — J189 Pneumonia, unspecified organism: Secondary | ICD-10-CM | POA: Diagnosis not present

## 2021-11-13 DIAGNOSIS — I110 Hypertensive heart disease with heart failure: Secondary | ICD-10-CM | POA: Diagnosis not present

## 2021-11-13 DIAGNOSIS — J45901 Unspecified asthma with (acute) exacerbation: Secondary | ICD-10-CM | POA: Diagnosis not present

## 2021-11-13 DIAGNOSIS — I361 Nonrheumatic tricuspid (valve) insufficiency: Secondary | ICD-10-CM | POA: Diagnosis not present

## 2021-11-13 DIAGNOSIS — F32A Depression, unspecified: Secondary | ICD-10-CM | POA: Diagnosis not present

## 2021-11-13 DIAGNOSIS — G629 Polyneuropathy, unspecified: Secondary | ICD-10-CM | POA: Diagnosis not present

## 2021-11-13 DIAGNOSIS — J9600 Acute respiratory failure, unspecified whether with hypoxia or hypercapnia: Secondary | ICD-10-CM | POA: Diagnosis not present

## 2021-11-13 DIAGNOSIS — Z91148 Patient's other noncompliance with medication regimen for other reason: Secondary | ICD-10-CM | POA: Diagnosis not present

## 2021-11-13 DIAGNOSIS — Z79899 Other long term (current) drug therapy: Secondary | ICD-10-CM | POA: Diagnosis not present

## 2021-11-13 DIAGNOSIS — K219 Gastro-esophageal reflux disease without esophagitis: Secondary | ICD-10-CM | POA: Diagnosis not present

## 2021-11-13 DIAGNOSIS — F419 Anxiety disorder, unspecified: Secondary | ICD-10-CM | POA: Diagnosis not present

## 2021-11-13 DIAGNOSIS — I509 Heart failure, unspecified: Secondary | ICD-10-CM | POA: Diagnosis not present

## 2021-11-13 DIAGNOSIS — G4700 Insomnia, unspecified: Secondary | ICD-10-CM | POA: Diagnosis not present

## 2021-11-13 DIAGNOSIS — G8929 Other chronic pain: Secondary | ICD-10-CM | POA: Diagnosis not present

## 2021-11-13 DIAGNOSIS — Z888 Allergy status to other drugs, medicaments and biological substances status: Secondary | ICD-10-CM | POA: Diagnosis not present

## 2021-11-13 DIAGNOSIS — M1990 Unspecified osteoarthritis, unspecified site: Secondary | ICD-10-CM | POA: Diagnosis not present

## 2021-11-13 DIAGNOSIS — Z85828 Personal history of other malignant neoplasm of skin: Secondary | ICD-10-CM | POA: Diagnosis not present

## 2021-11-13 DIAGNOSIS — G2581 Restless legs syndrome: Secondary | ICD-10-CM | POA: Diagnosis not present

## 2021-11-13 DIAGNOSIS — I517 Cardiomegaly: Secondary | ICD-10-CM | POA: Diagnosis not present

## 2021-11-13 DIAGNOSIS — Z882 Allergy status to sulfonamides status: Secondary | ICD-10-CM | POA: Diagnosis not present

## 2021-11-13 DIAGNOSIS — Z7982 Long term (current) use of aspirin: Secondary | ICD-10-CM | POA: Diagnosis not present

## 2021-11-13 DIAGNOSIS — R0602 Shortness of breath: Secondary | ICD-10-CM | POA: Diagnosis not present

## 2021-11-13 DIAGNOSIS — Z792 Long term (current) use of antibiotics: Secondary | ICD-10-CM | POA: Diagnosis not present

## 2021-11-13 DIAGNOSIS — R0600 Dyspnea, unspecified: Secondary | ICD-10-CM | POA: Diagnosis not present

## 2021-11-13 DIAGNOSIS — J440 Chronic obstructive pulmonary disease with acute lower respiratory infection: Secondary | ICD-10-CM | POA: Diagnosis not present

## 2021-11-13 DIAGNOSIS — J90 Pleural effusion, not elsewhere classified: Secondary | ICD-10-CM | POA: Diagnosis not present

## 2021-11-13 DIAGNOSIS — R918 Other nonspecific abnormal finding of lung field: Secondary | ICD-10-CM | POA: Diagnosis not present

## 2021-11-13 DIAGNOSIS — J189 Pneumonia, unspecified organism: Secondary | ICD-10-CM | POA: Diagnosis not present

## 2021-11-13 DIAGNOSIS — I5043 Acute on chronic combined systolic (congestive) and diastolic (congestive) heart failure: Secondary | ICD-10-CM | POA: Diagnosis not present

## 2021-11-13 DIAGNOSIS — I351 Nonrheumatic aortic (valve) insufficiency: Secondary | ICD-10-CM | POA: Diagnosis not present

## 2021-11-13 DIAGNOSIS — J9621 Acute and chronic respiratory failure with hypoxia: Secondary | ICD-10-CM | POA: Diagnosis not present

## 2021-11-13 DIAGNOSIS — Z8701 Personal history of pneumonia (recurrent): Secondary | ICD-10-CM | POA: Diagnosis not present

## 2021-11-13 DIAGNOSIS — Z23 Encounter for immunization: Secondary | ICD-10-CM | POA: Diagnosis not present

## 2021-11-13 DIAGNOSIS — I119 Hypertensive heart disease without heart failure: Secondary | ICD-10-CM | POA: Diagnosis not present

## 2021-11-13 DIAGNOSIS — I340 Nonrheumatic mitral (valve) insufficiency: Secondary | ICD-10-CM | POA: Diagnosis not present

## 2021-11-13 DIAGNOSIS — R059 Cough, unspecified: Secondary | ICD-10-CM | POA: Diagnosis not present

## 2021-11-14 DIAGNOSIS — I119 Hypertensive heart disease without heart failure: Secondary | ICD-10-CM

## 2021-11-14 DIAGNOSIS — I5043 Acute on chronic combined systolic (congestive) and diastolic (congestive) heart failure: Secondary | ICD-10-CM

## 2021-11-14 DIAGNOSIS — I361 Nonrheumatic tricuspid (valve) insufficiency: Secondary | ICD-10-CM

## 2021-11-14 DIAGNOSIS — J189 Pneumonia, unspecified organism: Secondary | ICD-10-CM

## 2021-11-14 DIAGNOSIS — I351 Nonrheumatic aortic (valve) insufficiency: Secondary | ICD-10-CM

## 2021-11-14 DIAGNOSIS — I340 Nonrheumatic mitral (valve) insufficiency: Secondary | ICD-10-CM

## 2021-11-15 DIAGNOSIS — I5043 Acute on chronic combined systolic (congestive) and diastolic (congestive) heart failure: Secondary | ICD-10-CM | POA: Diagnosis not present

## 2021-11-15 DIAGNOSIS — I340 Nonrheumatic mitral (valve) insufficiency: Secondary | ICD-10-CM | POA: Diagnosis not present

## 2021-11-15 DIAGNOSIS — J189 Pneumonia, unspecified organism: Secondary | ICD-10-CM | POA: Diagnosis not present

## 2021-11-15 DIAGNOSIS — I119 Hypertensive heart disease without heart failure: Secondary | ICD-10-CM | POA: Diagnosis not present

## 2021-11-16 DIAGNOSIS — I119 Hypertensive heart disease without heart failure: Secondary | ICD-10-CM | POA: Diagnosis not present

## 2021-11-16 DIAGNOSIS — I5043 Acute on chronic combined systolic (congestive) and diastolic (congestive) heart failure: Secondary | ICD-10-CM | POA: Diagnosis not present

## 2021-11-16 DIAGNOSIS — I340 Nonrheumatic mitral (valve) insufficiency: Secondary | ICD-10-CM | POA: Diagnosis not present

## 2021-11-16 DIAGNOSIS — J189 Pneumonia, unspecified organism: Secondary | ICD-10-CM | POA: Diagnosis not present

## 2021-11-26 DIAGNOSIS — F411 Generalized anxiety disorder: Secondary | ICD-10-CM | POA: Diagnosis not present

## 2021-11-26 DIAGNOSIS — J431 Panlobular emphysema: Secondary | ICD-10-CM | POA: Diagnosis not present

## 2021-11-26 DIAGNOSIS — N1832 Chronic kidney disease, stage 3b: Secondary | ICD-10-CM | POA: Diagnosis not present

## 2021-11-26 DIAGNOSIS — I5020 Unspecified systolic (congestive) heart failure: Secondary | ICD-10-CM | POA: Diagnosis not present

## 2021-11-26 DIAGNOSIS — E663 Overweight: Secondary | ICD-10-CM | POA: Diagnosis not present

## 2021-11-26 DIAGNOSIS — I110 Hypertensive heart disease with heart failure: Secondary | ICD-10-CM | POA: Diagnosis not present

## 2021-11-26 DIAGNOSIS — I700 Atherosclerosis of aorta: Secondary | ICD-10-CM | POA: Diagnosis not present

## 2021-11-26 DIAGNOSIS — G4700 Insomnia, unspecified: Secondary | ICD-10-CM | POA: Diagnosis not present

## 2021-11-26 DIAGNOSIS — I340 Nonrheumatic mitral (valve) insufficiency: Secondary | ICD-10-CM | POA: Diagnosis not present

## 2021-11-26 DIAGNOSIS — I70213 Atherosclerosis of native arteries of extremities with intermittent claudication, bilateral legs: Secondary | ICD-10-CM | POA: Diagnosis not present

## 2021-11-26 DIAGNOSIS — R7301 Impaired fasting glucose: Secondary | ICD-10-CM | POA: Diagnosis not present

## 2021-11-26 DIAGNOSIS — I5030 Unspecified diastolic (congestive) heart failure: Secondary | ICD-10-CM | POA: Diagnosis not present

## 2021-12-18 DIAGNOSIS — M545 Low back pain, unspecified: Secondary | ICD-10-CM | POA: Diagnosis not present

## 2021-12-18 DIAGNOSIS — S300XXA Contusion of lower back and pelvis, initial encounter: Secondary | ICD-10-CM | POA: Diagnosis not present

## 2021-12-18 DIAGNOSIS — M546 Pain in thoracic spine: Secondary | ICD-10-CM | POA: Diagnosis not present

## 2022-01-02 ENCOUNTER — Other Ambulatory Visit: Payer: Self-pay

## 2022-01-02 DIAGNOSIS — K219 Gastro-esophageal reflux disease without esophagitis: Secondary | ICD-10-CM | POA: Insufficient documentation

## 2022-01-03 ENCOUNTER — Encounter: Payer: Self-pay | Admitting: Cardiology

## 2022-01-03 ENCOUNTER — Other Ambulatory Visit: Payer: Self-pay | Admitting: Cardiology

## 2022-01-03 ENCOUNTER — Ambulatory Visit: Payer: Medicare HMO | Attending: Cardiology | Admitting: Cardiology

## 2022-01-03 VITALS — BP 134/56 | HR 55 | Ht 60.0 in | Wt 145.8 lb

## 2022-01-03 DIAGNOSIS — I2510 Atherosclerotic heart disease of native coronary artery without angina pectoris: Secondary | ICD-10-CM

## 2022-01-03 DIAGNOSIS — E782 Mixed hyperlipidemia: Secondary | ICD-10-CM

## 2022-01-03 DIAGNOSIS — I340 Nonrheumatic mitral (valve) insufficiency: Secondary | ICD-10-CM | POA: Diagnosis not present

## 2022-01-03 DIAGNOSIS — I10 Essential (primary) hypertension: Secondary | ICD-10-CM

## 2022-01-03 DIAGNOSIS — I2583 Coronary atherosclerosis due to lipid rich plaque: Secondary | ICD-10-CM

## 2022-01-03 DIAGNOSIS — I5022 Chronic systolic (congestive) heart failure: Secondary | ICD-10-CM

## 2022-01-03 MED ORDER — ENTRESTO 24-26 MG PO TABS
1.0000 | ORAL_TABLET | Freq: Two times a day (BID) | ORAL | 12 refills | Status: DC
Start: 2022-01-03 — End: 2022-04-17

## 2022-01-03 NOTE — Patient Instructions (Signed)
Medication Instructions:  Your physician has recommended you make the following change in your medication:   Stop Amlodipine  Stop Valsartan  Start Entresto 24-26 mg twice daily tomorrow 01/04/22. You need a nurse visit in 1 week for vital signs to review your BP log and labs.   *If you need a refill on your cardiac medications before your next appointment, please call your pharmacy*   Lab Work: Your physician recommends that you have a BMET today in the office and again in 1 week.  If you have labs (blood work) drawn today and your tests are completely normal, you will receive your results only by: [place] (if you have MyChart) OR A paper copy in the mail If you have any lab test that is abnormal or we need to change your treatment, we will call you to review the results.   Testing/Procedures: None ordered   Follow-Up: At [facility], you and your health needs are our priority.  As part of our continuing mission to provide you with exceptional heart care, we have created designated Provider Care Teams.  These Care Teams include your primary Cardiologist (physician) and Advanced Practice Providers (APPs -  Physician Assistants and Nurse Practitioners) who all work together to provide you with the care you need, when you need it.  We recommend signing up for the patient portal called "MyChart".  Sign up information is provided on this After Visit Summary.  MyChart is used to connect with patients for Virtual Visits (Telemedicine).  Patients are able to view lab/test results, encounter notes, upcoming appointments, etc.  Non-urgent messages can be sent to your provider as well.   To learn more about what you can do with MyChart, go to [email or web address].    Your next appointment:   1 month(s)  The format for your next appointment:   In Person  Provider:   Jyl Heinz, MD   Other Instructions  Sacubitril; Valsartan Tablets What is this  medication? SACUBITRIL; VALSARTAN (sak UE bi tril; val SAR tan) treats heart failure. It works by relaxing blood vessels, which decreases the amount of work the heart has to do. It also helps your kidneys remove more fluid and salt from your blood through the urine. It is a combination of a neprilysin inhibitor and an ARB. This medicine may be used for other purposes; ask your health care provider or pharmacist if you have questions. COMMON BRAND NAME(S): Entresto What should I tell my care team before I take this medication? They need to know if you have any of these conditions: Diabetes and take a medication that contains aliskiren High levels of potassium in the blood Kidney disease Liver disease Low blood pressure An unusual or allergic reaction to sacubitril, valsartan, other medications, foods, dyes, or preservatives Pregnant or trying to get pregnant Breast-feeding How should I use this medication? Take this medication by mouth. Take it as directed on the prescription label at the same time every day. You can take it with or without food. If it upsets your stomach, take it with food. Keep taking it unless your care team tells you to stop. Talk to your care team about the use of this medication in children. While it may be prescribed for children as young as 1 year for selected conditions, precautions do apply. Overdosage: If you think you have taken too much of this medicine contact a poison control center or emergency room at once. NOTE: This medicine is only for you. Do not share  this medicine with others. What if I miss a dose? If you miss a dose, take it as soon as you can. If it is almost time for your next dose, take only that dose. Do not take double or extra doses. What may interact with this medication? Do not take this medication with any of the following: Aliskiren if you have diabetes Angiotensin-converting enzyme (ACE) inhibitors, such as benazepril, captopril, enalapril,  fosinopril, lisinopril, or ramipril Tranylcypromine This medication may also interact with the following: Angiotensin II receptor blockers (ARBs), such as azilsartan, candesartan, eprosartan, irbesartan, losartan, olmesartan, telmisartan, or valsartan Celecoxib Lithium NSAIDS, medications for pain and inflammation, such as ibuprofen or naproxen Potassium-sparing diuretics, such as amiloride, spironolactone, and triamterene Potassium supplements This list may not describe all possible interactions. Give your health care provider a list of all the medicines, herbs, non-prescription drugs, or dietary supplements you use. Also tell them if you smoke, drink alcohol, or use illegal drugs. Some items may interact with your medicine. What should I watch for while using this medication? Tell your care team if your symptoms do not start to get better or if they get worse. Talk to your care team if you wish to become pregnant or think you might be pregnant. This medication can cause serious birth defects. This medication may affect your coordination, reaction time, or judgment. Do not drive or operate machinery until you know how this medication affects you. Sit up or stand slowly to reduce the risk of dizzy or fainting spells. Drinking alcohol with this medication can increase the risk of these side effects. Avoid salt substitutes unless you are told otherwise by your care team. What side effects may I notice from receiving this medication? Side effects that you should report to your care team as soon as possible: Allergic reactions or angioedema--skin rash, itching or hives, swelling of the face, eyes, lips, tongue, arms, or legs, trouble swallowing or breathing High potassium level--muscle weakness, fast or irregular heartbeat Kidney injury--decrease in the amount of urine, swelling of the ankles, hands, or feet Low blood pressure--dizziness, feeling faint or lightheaded, blurry vision Side effects that  usually do not require medical attention (report to your care team if they continue or are bothersome): Cough This list may not describe all possible side effects. Call your doctor for medical advice about side effects. You may report side effects to FDA at 1-800-FDA-1088. Where should I keep my medication? Keep out of the reach of children and pets. Store at room temperature between 20 and 25 degrees C (68 and 77 degrees F). Protect from moisture. Keep the container tightly closed. Get rid of any unused medication after the expiration date. To get rid of medications that are no longer needed or have expired: Take the medication to a take-back program. Check with your pharmacy or law enforcement to find a location. If you cannot return the medication, check the label or package insert to see if the medication should be thrown out in the garbage or flushed down the toilet. If you are not sure, ask your care team. If it is safe to put it in the trash, empty the medication out of the container. Mix the medication with cat litter, dirt, coffee grounds, or other unwanted substance. Seal the mixture in a bag or container. Put it in the trash. NOTE: This sheet is a summary. It may not cover all possible information. If you have questions about this medicine, talk to your doctor, pharmacist, or health care provider.  2023 Elsevier/Gold Standard (2021-02-27 00:00:00)

## 2022-01-03 NOTE — Telephone Encounter (Signed)
Rx refill sent to pharmacy. 

## 2022-01-03 NOTE — Progress Notes (Signed)
Cardiology Office Note:    Date:  01/03/2022   ID:  Kelsey Rivera, DOB 01/13/1946, MRN [id number]  PCP:  Street, [place] Mt, MD  Cardiologist:  [name], MD   Referring MD: [address] [place], *    ASSESSMENT:    1. Chronic systolic heart failure ([place])   2. Coronary artery disease due to lipid rich plaque   3. Essential hypertension   4. Mixed hyperlipidemia   5. Mitral valve insufficiency, unspecified etiology    PLAN:    In order of problems listed above:  Coronary artery disease: Secondary prevention stressed with the patient.  Importance of compliance with diet medication stressed and she vocalized understanding. Cardiomyopathy with mitral regurgitation: I discussed this with the patient at extensive length.  Medications such as Delene Loll discussed and she is agreeable.  I will do a Chem-7 today.  I have stopped the valsartan and Norvasc.  I will initiate her on Entresto beginning tomorrow.  She will be back in 1 week for a nurse visit at which time she will have a Chem-7 pulse blood pressure check and she will Regular blood pressure log at home and heart rate and bring it to [place].  Benefits and potential risks explained and she vocalized understanding and questions were answered to her satisfaction. Essential hypertension: Blood pressure stable and diet was emphasized.  Lifestyle modification urged. Mixed dyslipidemia on lipid-lowering medications.  She had blood work recently by primary care and this is reviewed by me.  I have detailed records from primary care and [place] [facility] and reviewed them and questions were answered to her satisfaction. Patient will be seen in follow-up appointment in 4 weeks or earlier if the patient has any concerns    Medication Adjustments/Labs and Tests Ordered: Current medicines are reviewed at length with the patient today.  Concerns regarding medicines are outlined above.  No orders of the defined types were placed in this  encounter.  No orders of the defined types were placed in this encounter.    No chief complaint on file.    History of Present Illness:    Kelsey Rivera is a 76 y.o. female.  Patient has past medical history of essential hypertension, mixed dyslipidemia, cardiomyopathy with mildly depressed ejection fraction and mild to moderate mitral regurgitation.  She denies any problems at this time and takes care of activities of daily living.  No chest pain orthopnea or PND.  She has lost weight and is happy about it.  She is doing better with diet and exercise.  She has had a bout of pneumonia and was treated at the hospital with this.  At the time of my evaluation, the patient is alert awake oriented and in no distress.  Past Medical History:  Diagnosis Date   AKI (acute kidney injury) ([place]) 05/20/2018   Arthritis of foot, left 01/09/2016   Baker's cyst of knee 02/06/2017   Bilateral lower extremity edema 05/20/2018   Chest pain in adult 04/25/2015   Overview:  Lexiscan  MPS with normal perfusion and function, EF 52%   Chronic diastolic (congestive) heart failure ([place]) 05/20/2018   Echo - 04/2015 EF 87-86% Grade II diastolic dysfunction. Moderate mitral regurgitation   Chronic systolic heart failure (HCC)    CKD (chronic kidney disease) stage 3, GFR 30-59 ml/min (HCC) 05/20/2018   Closed dislocation of right elbow 04/24/2019   Added automatically from request for surgery [id number]   Coronary artery disease due to lipid rich plaque 10/22/2018  Degenerative arthritis of right foot 02/23/2020   Epistaxis 03/24/2020   Essential hypertension 04/26/2015   Extensor tendonitis of foot 12/14/2015   Overview:  Left   Finger pain, right 05/10/2019   GERD (gastroesophageal reflux disease)    Hyperlipidemia 02/06/2017   Injury of left foot 07/25/2015   LBBB (left bundle branch block) 04/25/2015   Migraine 02/06/2017   Mitral regurgitation 12/17/2017   Osteoarthritis 02/06/2017   Osteoarthritis of  left foot 11/14/2015   Painful orthopaedic hardware ([place]) 09/20/2019   Pathological fracture of metatarsal bone of left foot with routine healing 08/15/2015   Plantar fasciitis of left foot 06/03/2017   Primary hypertension 04/26/2015   Primary osteoarthritis of both hands 08/22/2020   RLS (restless legs syndrome) 04/26/2015   Status post hardware removal 11/04/2019   Symptomatic bradycardia 03/15/2020   Transient complete heart block ([place]) 03/17/2020   Venous ulcer of left leg ([place]) 02/19/2017    Past Surgical History:  Procedure Laterality Date   ABDOMINAL HYSTERECTOMY     CHOLECYSTECTOMY     FOOT FRACTURE SURGERY     RIGHT/LEFT HEART CATH AND CORONARY ANGIOGRAPHY N/A 03/15/2020   Procedure: RIGHT/LEFT HEART CATH AND CORONARY ANGIOGRAPHY;  Surgeon: Jettie Booze, MD;  Location: [facility];  Service: Cardiovascular;  Laterality: N/A;   TEE WITHOUT CARDIOVERSION N/A 03/17/2020   Procedure: TRANSESOPHAGEAL ECHOCARDIOGRAM (TEE);  Surgeon: Sanda Klein, MD;  Location: [facility];  Service: Cardiovascular;  Laterality: N/A;   TONSILLECTOMY     VEIN SURGERY      Current Medications: Current Meds  Medication Sig   albuterol (VENTOLIN HFA) 108 (90 Base) MCG/ACT inhaler Inhale 1-2 puffs into the lungs as needed for wheezing or shortness of breath.    amLODipine (NORVASC) 5 MG tablet Take 5 mg by mouth daily.   aspirin EC 81 MG tablet Take 1 tablet (81 mg total) by mouth daily.   atorvastatin (LIPITOR) 20 MG tablet TAKE 1 TABLET BY MOUTH EVERY DAY   bumetanide (BUMEX) 2 MG tablet Take 2 mg by mouth every other day.   Calcium Carb-Cholecalciferol (CALCIUM-VITAMIN D3) 600-400 MG-UNIT TABS Take 2 tablets by mouth daily.   diazepam (VALIUM) 10 MG tablet Take 10 mg by mouth every other day.   escitalopram (LEXAPRO) 5 MG tablet Take 5 mg by mouth daily.   gabapentin (NEURONTIN) 300 MG capsule Take 300 mg by mouth at bedtime.    hydrochlorothiazide (HYDRODIURIL) 25 MG tablet  Take 25 mg by mouth daily.    hydrOXYzine (ATARAX/VISTARIL) 25 MG tablet Take 25 mg by mouth as needed for anxiety.    ketoconazole (NIZORAL) 2 % cream Apply 1 application topically as needed for rash.   KLOR-CON M10 10 MEQ tablet Take 10 mEq by mouth daily.   methocarbamol (ROBAXIN) 500 MG tablet Take 1,000 mg by mouth daily as needed for muscle spasms (back spasms).    nitroGLYCERIN (NITROSTAT) 0.4 MG SL tablet Place 0.4 mg under the tongue every 5 (five) minutes as needed for chest pain.   omeprazole (PRILOSEC) 20 MG capsule Take 20 mg by mouth as needed for indigestion.   rOPINIRole (REQUIP) 4 MG tablet Take 4 mg by mouth 2 (two) times daily.   torsemide (DEMADEX) 20 MG tablet Take 20 mg by mouth daily.   traMADol (ULTRAM) 50 MG tablet Take 50 mg by mouth every 6 (six) hours as needed for pain.   valsartan (DIOVAN) 40 MG tablet Take 40 mg by mouth 2 (two) times daily.  zolpidem (AMBIEN) 5 MG tablet Take 5 mg by mouth at bedtime as needed for sleep.      Allergies:   Lasix [furosemide] and Sulfa antibiotics   Social History   Socioeconomic History   Marital status: Widowed    Spouse name: Not on file   Number of children: 2   Years of education: 12   Highest education level: High school graduate  Occupational History   Occupation: retired   Tobacco Use   Smoking status: Never   Smokeless tobacco: Never  Vaping Use   Vaping Use: Never used  Substance and Sexual Activity   Alcohol use: No   Drug use: No   Sexual activity: Not on file  Other Topics Concern   Not on file  Social History Narrative   Not on file   Social Determinants of Health   Financial Resource Strain: Low Risk  (03/15/2020)   Overall Financial Resource Strain (CARDIA)    Difficulty of Paying Living Expenses: Not hard at all  Food Insecurity: No Food Insecurity (03/15/2020)   Hunger Vital Sign    Worried About Running Out of Food in the Last Year: Never true    [place] in the Last Year: Never  true  Transportation Needs: No Transportation Needs (03/15/2020)   PRAPARE - Hydrologist (Medical): No    Lack of Transportation (Non-Medical): No  Physical Activity: Insufficiently Active (03/15/2020)   Exercise Vital Sign    Days of Exercise per Week: 3 days    Minutes of Exercise per Session: 30 min  Stress: No Stress Concern Present (03/15/2020)   [place]    Feeling of Stress : Not at all  Social Connections: Moderately Integrated (03/15/2020)   Social Connection and Isolation Panel [NHANES]    Frequency of Communication with Friends and Family: More than three times a week    Frequency of Social Gatherings with Friends and Family: More than three times a week    Attends Religious Services: More than 4 times per year    Active Member of [employer] or Organizations: Yes    Attends Archivist Meetings: More than 4 times per year    Marital Status: Widowed     Family History: The patient's family history includes CAD in her brother; Cancer in her brother; Heart attack in her father and mother; Pulmonary embolism in her brother.  ROS:   Please see the history of present illness.    All other systems reviewed and are negative.  EKGs/Labs/Other Studies Reviewed:    The following studies were reviewed today: EKG reveals sinus rhythm bradycardia nonspecific ST-T changes   Recent Labs: No results found for requested labs within last 365 days.  Recent Lipid Panel    Component Value Date/Time   CHOL 144 02/10/2019 0816   TRIG 54 02/10/2019 0816   HDL 70 02/10/2019 0816   CHOLHDL 2.1 02/10/2019 0816   LDLCALC 62 02/10/2019 0816    Physical Exam:    VS:  BP (!) 134/56   Pulse (!) 55   Ht 5' (1.524 m)   Wt 145 lb 12.8 oz (66.1 kg)   SpO2 98%   BMI 28.47 kg/m     Wt Readings from Last 3 Encounters:  01/03/22 145 lb 12.8 oz (66.1 kg)  08/08/20 162 lb (73.5 kg)   05/11/20 165 lb (74.8 kg)     GEN: Patient is in no  acute distress HEENT: Normal NECK: No JVD; No carotid bruits LYMPHATICS: No lymphadenopathy CARDIAC: Hear sounds regular, 2/6 systolic murmur at the apex. RESPIRATORY:  Clear to auscultation without rales, wheezing or rhonchi  ABDOMEN: Soft, non-tender, non-distended MUSCULOSKELETAL:  No edema; No deformity  SKIN: Warm and dry NEUROLOGIC:  Alert and oriented x 3 PSYCHIATRIC:  Normal affect   Signed, [name], MD  01/03/2022 9:03 AM    [place]

## 2022-01-04 ENCOUNTER — Telehealth: Payer: Self-pay

## 2022-01-04 LAB — BASIC METABOLIC PANEL
BUN/Creatinine Ratio: 26 (ref 12–28)
BUN: 24 mg/dL (ref 8–27)
CO2: 24 mmol/L (ref 20–29)
Calcium: 9.2 mg/dL (ref 8.7–10.3)
Chloride: 105 mmol/L (ref 96–106)
Creatinine, Ser: 0.94 mg/dL (ref 0.57–1.00)
Glucose: 97 mg/dL (ref 70–99)
Potassium: 4.4 mmol/L (ref 3.5–5.2)
Sodium: 140 mmol/L (ref 134–144)
eGFR: 63 mL/min/{1.73_m2} (ref >59)

## 2022-01-04 NOTE — Telephone Encounter (Signed)
Pt states that she doesn't feel good after taking the Entresto. Pt will callback once she checks her blood pressure.

## 2022-01-04 NOTE — Telephone Encounter (Signed)
-----   Message from [name], MD sent at 01/04/2022  8:01 AM EDT ----- The results of the study is unremarkable. Please inform patient. I will discuss in detail at next appointment. Cc  primary care/referring physician [name], MD 01/04/2022 8:01 AM

## 2022-01-09 DIAGNOSIS — Z79899 Other long term (current) drug therapy: Secondary | ICD-10-CM | POA: Diagnosis not present

## 2022-01-09 DIAGNOSIS — M2550 Pain in unspecified joint: Secondary | ICD-10-CM | POA: Diagnosis not present

## 2022-01-09 DIAGNOSIS — M19041 Primary osteoarthritis, right hand: Secondary | ICD-10-CM | POA: Diagnosis not present

## 2022-01-09 DIAGNOSIS — M19042 Primary osteoarthritis, left hand: Secondary | ICD-10-CM | POA: Diagnosis not present

## 2022-01-10 ENCOUNTER — Ambulatory Visit: Payer: Medicare HMO | Attending: Cardiology

## 2022-01-10 VITALS — BP 154/62 | HR 60 | Ht 60.0 in | Wt 149.4 lb

## 2022-01-10 DIAGNOSIS — I5032 Chronic diastolic (congestive) heart failure: Secondary | ICD-10-CM

## 2022-01-10 DIAGNOSIS — I5022 Chronic systolic (congestive) heart failure: Secondary | ICD-10-CM

## 2022-01-10 NOTE — Progress Notes (Signed)
   Nurse Visit   Date of Encounter: 01/10/2022 ID: Kelsey Rivera, DOB [birth year], MRN [id number]  PCP:  Street, Sharon Mt, MD   [facility] Cardiologist:  [name], MD      Visit Details   VS:  BP (!) 154/62   Pulse 60   Ht 5' (1.524 m)   Wt 149 lb 6.4 oz (67.8 kg)   SpO2 97%   BMI 29.18 kg/m  , BMI Body mass index is 29.18 kg/m.  Wt Readings from Last 3 Encounters:  01/10/22 149 lb 6.4 oz (67.8 kg)  01/03/22 145 lb 12.8 oz (66.1 kg)  08/08/20 162 lb (73.5 kg)     Reason for visit: Vital signs, labs and review BP log Performed today: Vitals, Provider consulted:Revankar, and Education Changes (medications, testing, etc.) : No changes Length of Visit: 15 minutes    Medications Adjustments/Labs and Tests Ordered: Orders Placed This Encounter  Procedures   Basic metabolic panel   No orders of the defined types were placed in this encounter.    Signed, Truddie Hidden, RN  01/10/2022 4:54 PM

## 2022-01-11 ENCOUNTER — Telehealth: Payer: Self-pay | Admitting: Cardiology

## 2022-01-11 LAB — BASIC METABOLIC PANEL
BUN/Creatinine Ratio: 19 (ref 12–28)
BUN: 14 mg/dL (ref 8–27)
CO2: 25 mmol/L (ref 20–29)
Calcium: 9.2 mg/dL (ref 8.7–10.3)
Chloride: 106 mmol/L (ref 96–106)
Creatinine, Ser: 0.73 mg/dL (ref 0.57–1.00)
Glucose: 99 mg/dL (ref 70–99)
Potassium: 4 mmol/L (ref 3.5–5.2)
Sodium: 142 mmol/L (ref 134–144)
eGFR: 85 mL/min/{1.73_m2} (ref >59)

## 2022-01-11 NOTE — Telephone Encounter (Signed)
Patient advised of Dr. Julien Nordmann recommendations.  Patient will bring her log in early next week and he will make a recommendation on it.  Patient thanked me for the call and had no additional questions.

## 2022-01-11 NOTE — Telephone Encounter (Signed)
Pt c/o medication issue:  1. Name of Medication:   sacubitril-valsartan (ENTRESTO) 24-26 MG    2. How are you currently taking this medication (dosage and times per day)? Take 1 tablet by mouth 2 (two) times daily.  3. Are you having a reaction (difficulty breathing--STAT)? No  4. What is your medication issue? States that pt was in their office today and kept falling asleep during visit. Pt stated that, that is what happens every time she takes medication. She gets tired and sleepy. Office would like a callback to see if dosage can be cut in half. Please advise

## 2022-01-14 ENCOUNTER — Telehealth: Payer: Self-pay

## 2022-01-14 NOTE — Telephone Encounter (Signed)
Dr. [name] reviewed her blood pressure and pulse readings and recommendations patient stay on the [facility].  Patient did not answer. Will attempt to call back at another time.

## 2022-01-15 DIAGNOSIS — E261 Secondary hyperaldosteronism: Secondary | ICD-10-CM | POA: Diagnosis not present

## 2022-01-15 DIAGNOSIS — J449 Chronic obstructive pulmonary disease, unspecified: Secondary | ICD-10-CM | POA: Diagnosis not present

## 2022-01-15 DIAGNOSIS — E876 Hypokalemia: Secondary | ICD-10-CM | POA: Diagnosis not present

## 2022-01-15 DIAGNOSIS — J45990 Exercise induced bronchospasm: Secondary | ICD-10-CM | POA: Diagnosis not present

## 2022-01-15 DIAGNOSIS — Z008 Encounter for other general examination: Secondary | ICD-10-CM | POA: Diagnosis not present

## 2022-01-15 DIAGNOSIS — I509 Heart failure, unspecified: Secondary | ICD-10-CM | POA: Diagnosis not present

## 2022-01-15 DIAGNOSIS — G4700 Insomnia, unspecified: Secondary | ICD-10-CM | POA: Diagnosis not present

## 2022-01-15 DIAGNOSIS — I951 Orthostatic hypotension: Secondary | ICD-10-CM | POA: Diagnosis not present

## 2022-01-15 DIAGNOSIS — I110 Hypertensive heart disease with heart failure: Secondary | ICD-10-CM | POA: Diagnosis not present

## 2022-01-15 DIAGNOSIS — R69 Illness, unspecified: Secondary | ICD-10-CM | POA: Diagnosis not present

## 2022-01-15 DIAGNOSIS — G2581 Restless legs syndrome: Secondary | ICD-10-CM | POA: Diagnosis not present

## 2022-01-15 DIAGNOSIS — M353 Polymyalgia rheumatica: Secondary | ICD-10-CM | POA: Diagnosis not present

## 2022-01-21 DIAGNOSIS — M21612 Bunion of left foot: Secondary | ICD-10-CM

## 2022-01-21 HISTORY — DX: Bunion of left foot: M21.612

## 2022-01-22 DIAGNOSIS — J069 Acute upper respiratory infection, unspecified: Secondary | ICD-10-CM | POA: Diagnosis not present

## 2022-01-22 DIAGNOSIS — R070 Pain in throat: Secondary | ICD-10-CM | POA: Diagnosis not present

## 2022-01-22 DIAGNOSIS — S81811A Laceration without foreign body, right lower leg, initial encounter: Secondary | ICD-10-CM | POA: Diagnosis not present

## 2022-01-22 DIAGNOSIS — R0981 Nasal congestion: Secondary | ICD-10-CM | POA: Diagnosis not present

## 2022-01-26 DIAGNOSIS — N1832 Chronic kidney disease, stage 3b: Secondary | ICD-10-CM | POA: Diagnosis not present

## 2022-01-26 DIAGNOSIS — E538 Deficiency of other specified B group vitamins: Secondary | ICD-10-CM | POA: Diagnosis not present

## 2022-01-26 DIAGNOSIS — I2510 Atherosclerotic heart disease of native coronary artery without angina pectoris: Secondary | ICD-10-CM | POA: Diagnosis not present

## 2022-01-28 DIAGNOSIS — S81811D Laceration without foreign body, right lower leg, subsequent encounter: Secondary | ICD-10-CM | POA: Diagnosis not present

## 2022-01-28 DIAGNOSIS — Z6827 Body mass index (BMI) 27.0-27.9, adult: Secondary | ICD-10-CM | POA: Diagnosis not present

## 2022-01-31 ENCOUNTER — Other Ambulatory Visit: Payer: Self-pay

## 2022-02-04 ENCOUNTER — Encounter: Payer: Self-pay | Admitting: Cardiology

## 2022-02-04 ENCOUNTER — Ambulatory Visit: Payer: Medicare HMO | Attending: Cardiology | Admitting: Cardiology

## 2022-02-04 VITALS — BP 137/60 | HR 80 | Ht 60.0 in | Wt 147.0 lb

## 2022-02-04 DIAGNOSIS — I10 Essential (primary) hypertension: Secondary | ICD-10-CM

## 2022-02-04 DIAGNOSIS — I5022 Chronic systolic (congestive) heart failure: Secondary | ICD-10-CM | POA: Diagnosis not present

## 2022-02-04 DIAGNOSIS — E782 Mixed hyperlipidemia: Secondary | ICD-10-CM

## 2022-02-04 DIAGNOSIS — I340 Nonrheumatic mitral (valve) insufficiency: Secondary | ICD-10-CM

## 2022-02-04 NOTE — Patient Instructions (Signed)
Medication Instructions:  Your physician recommends that you continue on your current medications as directed. Please refer to the Current Medication list given to you today.  *If you need a refill on your cardiac medications before your next appointment, please call your pharmacy*   Lab Work: None ordered If you have labs (blood work) drawn today and your tests are completely normal, you will receive your results only by: [place] (if you have MyChart) OR A paper copy in the mail If you have any lab test that is abnormal or we need to change your treatment, we will call you to review the results.   Testing/Procedures: Your physician has requested that you have an echocardiogram. Echocardiography is a painless test that uses sound waves to create images of your heart. It provides your doctor with information about the size and shape of your heart and how well your heart's chambers and valves are working. This procedure takes approximately one hour. There are no restrictions for this procedure.    Follow-Up: At [facility], you and your health needs are our priority.  As part of our continuing mission to provide you with exceptional heart care, we have created designated Provider Care Teams.  These Care Teams include your primary Cardiologist (physician) and Advanced Practice Providers (APPs -  Physician Assistants and Nurse Practitioners) who all work together to provide you with the care you need, when you need it.  We recommend signing up for the patient portal called "MyChart".  Sign up information is provided on this After Visit Summary.  MyChart is used to connect with patients for Virtual Visits (Telemedicine).  Patients are able to view lab/test results, encounter notes, upcoming appointments, etc.  Non-urgent messages can be sent to your provider as well.   To learn more about what you can do with MyChart, go to [email or web address].    Your next appointment:   2  month(s)  The format for your next appointment:   In Person  Provider:   Jyl Heinz, MD   Other Instructions Echocardiogram An echocardiogram is a test that uses sound waves (ultrasound) to produce images of the heart. Images from an echocardiogram can provide important information about: Heart size and shape. The size and thickness and movement of your heart's walls. Heart muscle function and strength. Heart valve function or if you have stenosis. Stenosis is when the heart valves are too narrow. If blood is flowing backward through the heart valves (regurgitation). A tumor or infectious growth around the heart valves. Areas of heart muscle that are not working well because of poor blood flow or injury from a heart attack. Aneurysm detection. An aneurysm is a weak or damaged part of an artery wall. The wall bulges out from the normal force of blood pumping through the body. Tell a health care provider about: Any allergies you have. All medicines you are taking, including vitamins, herbs, eye drops, creams, and over-the-counter medicines. Any blood disorders you have. Any surgeries you have had. Any medical conditions you have. Whether you are pregnant or may be pregnant. What are the risks? Generally, this is a safe test. However, problems may occur, including an allergic reaction to dye (contrast) that may be used during the test. What happens before the test? No specific preparation is needed. You may eat and drink normally. What happens during the test? You will take off your clothes from the waist up and put on a hospital gown. Electrodes or electrocardiogram (ECG)patches may be placed on  your chest. The electrodes or patches are then connected to a device that monitors your heart rate and rhythm. You will lie down on a table for an ultrasound exam. A gel will be applied to your chest to help sound waves pass through your skin. A handheld device, called a transducer, will  be pressed against your chest and moved over your heart. The transducer produces sound waves that travel to your heart and bounce back (or "echo" back) to the transducer. These sound waves will be captured in real-time and changed into images of your heart that can be viewed on a video monitor. The images will be recorded on a computer and reviewed by your health care provider. You may be asked to change positions or hold your breath for a short time. This makes it easier to get different views or better views of your heart. In some cases, you may receive contrast through an IV in one of your veins. This can improve the quality of the pictures from your heart. The procedure may vary among health care providers and hospitals.   What can I expect after the test? You may return to your normal, everyday life, including diet, activities, and medicines, unless your health care provider tells you not to do that. Follow these instructions at home: It is up to you to get the results of your test. Ask your health care provider, or the department that is doing the test, when your results will be ready. Keep all follow-up visits. This is important. Summary An echocardiogram is a test that uses sound waves (ultrasound) to produce images of the heart. Images from an echocardiogram can provide important information about the size and shape of your heart, heart muscle function, heart valve function, and other possible heart problems. You do not need to do anything to prepare before this test. You may eat and drink normally. After the echocardiogram is completed, you may return to your normal, everyday life, unless your health care provider tells you not to do that. This information is not intended to replace advice given to you by your health care provider. Make sure you discuss any questions you have with your health care provider. Document Revised: 12/07/2019 Document Reviewed: 12/07/2019 Elsevier Patient  Education  2021 Elsevier Inc.   

## 2022-02-04 NOTE — Progress Notes (Signed)
Cardiology Office Note:    Date:  02/04/2022   ID:  KEMONIE CUTILLO, DOB 29-Apr-1946, MRN [id number]  PCP:  Street, [place], MD  Cardiologist:  [name], MD   Referring MD: [address] [place], *    ASSESSMENT:    1. Essential hypertension   2. Mixed hyperlipidemia   3. Mitral valve insufficiency, unspecified etiology   4. Chronic systolic heart failure (HCC)    PLAN:    In order of problems listed above:  Cardiomyopathy: I discussed my findings with the patient at extensive length.  She is tolerating Entresto well.  Diet was emphasized.  Congestive heart failure education was given to the patient at length.  She weighs herself with a regular basis.  She will have a Chem-7 today. Essential hypertension: Blood pressure stable and diet was emphasized.  Lifestyle modification urged. Mixed dyslipidemia: On lipid-lowering medications.  Followed by primary care.  Diet emphasized.  Weight reduction stressed. She will have an echocardiogram before she sees me in 3 months.  Patient had multiple questions which were answered to her satisfaction.   Medication Adjustments/Labs and Tests Ordered: Current medicines are reviewed at length with the patient today.  Concerns regarding medicines are outlined above.  Orders Placed This Encounter  Procedures   ECHOCARDIOGRAM COMPLETE   No orders of the defined types were placed in this encounter.    No chief complaint on file.    History of Present Illness:    Kelsey Rivera is a 76 y.o. female.  Patient has past medical history of essential hypertension, mixed dyslipidemia and recently diagnosed cardiomyopathy.  She denies any problems at this time and takes care of activities of daily living.  No chest pain orthopnea or PND.  At the time of my evaluation, the patient is alert awake oriented and in no distress.  She is now on Entresto and tolerating it well.  Past Medical History:  Diagnosis Date   AKI (acute kidney  injury) ([place]) 05/20/2018   Arthritis of foot, left 01/09/2016   Baker's cyst of knee 02/06/2017   Bilateral lower extremity edema 05/20/2018   Bunion of left foot 01/21/2022   Chest pain in adult 04/25/2015   Overview:  Lexiscan  MPS with normal perfusion and function, EF 52%   Chronic diastolic (congestive) heart failure ([place]) 05/20/2018   Echo - 04/2015 EF 26-20% Grade II diastolic dysfunction. Moderate mitral regurgitation   Chronic systolic heart failure (HCC)    CKD (chronic kidney disease) stage 3, GFR 30-59 ml/min (HCC) 05/20/2018   Closed dislocation of right elbow 04/24/2019   Added automatically from request for surgery [id number]   Coronary artery disease due to lipid rich plaque 10/22/2018   Degenerative arthritis of right foot 02/23/2020   Epistaxis 03/24/2020   Essential hypertension 04/26/2015   Extensor tendonitis of foot 12/14/2015   Overview:  Left   Finger pain, right 05/10/2019   GERD (gastroesophageal reflux disease)    Hyperlipidemia 02/06/2017   Injury of left foot 07/25/2015   LBBB (left bundle branch block) 04/25/2015   Migraine 02/06/2017   Mitral regurgitation 12/17/2017   Osteoarthritis 02/06/2017   Osteoarthritis of left foot 11/14/2015   Painful orthopaedic hardware ([place]) 09/20/2019   Pathological fracture of metatarsal bone of left foot with routine healing 08/15/2015   Plantar fasciitis of left foot 06/03/2017   Primary hypertension 04/26/2015   Primary osteoarthritis of both hands 08/22/2020   RLS (restless legs syndrome) 04/26/2015   Status post hardware removal  11/04/2019   Symptomatic bradycardia 03/15/2020   Transient complete heart block ([place]) 03/17/2020   Venous ulcer of left leg ([place]) 02/19/2017    Past Surgical History:  Procedure Laterality Date   ABDOMINAL HYSTERECTOMY     CHOLECYSTECTOMY     FOOT FRACTURE SURGERY     RIGHT/LEFT HEART CATH AND CORONARY ANGIOGRAPHY N/A 03/15/2020   Procedure: RIGHT/LEFT HEART CATH AND CORONARY  ANGIOGRAPHY;  Surgeon: Jettie Booze, MD;  Location: [facility];  Service: Cardiovascular;  Laterality: N/A;   TEE WITHOUT CARDIOVERSION N/A 03/17/2020   Procedure: TRANSESOPHAGEAL ECHOCARDIOGRAM (TEE);  Surgeon: Sanda Klein, MD;  Location: [facility];  Service: Cardiovascular;  Laterality: N/A;   TONSILLECTOMY     VEIN SURGERY      Current Medications: Current Meds  Medication Sig   albuterol (VENTOLIN HFA) 108 (90 Base) MCG/ACT inhaler Inhale 1-2 puffs into the lungs as needed for wheezing or shortness of breath.    aspirin EC 81 MG tablet Take 1 tablet (81 mg total) by mouth daily.   atorvastatin (LIPITOR) 20 MG tablet Take 1 tablet (20 mg total) by mouth daily.   bumetanide (BUMEX) 2 MG tablet Take 2 mg by mouth every other day.   Calcium Carb-Cholecalciferol (CALCIUM-VITAMIN D3) 600-400 MG-UNIT TABS Take 2 tablets by mouth daily.   diazepam (VALIUM) 10 MG tablet Take 10 mg by mouth every other day.   escitalopram (LEXAPRO) 5 MG tablet Take 5 mg by mouth daily.   gabapentin (NEURONTIN) 300 MG capsule Take 300 mg by mouth at bedtime.    hydrochlorothiazide (HYDRODIURIL) 25 MG tablet Take 25 mg by mouth daily.    hydrOXYzine (ATARAX/VISTARIL) 25 MG tablet Take 25 mg by mouth as needed for anxiety.    ketoconazole (NIZORAL) 2 % cream Apply 1 application topically as needed for rash.   KLOR-CON M10 10 MEQ tablet Take 10 mEq by mouth daily.   meloxicam (MOBIC) 15 MG tablet Take 15 mg by mouth daily.   methocarbamol (ROBAXIN) 500 MG tablet Take 1,000 mg by mouth daily as needed for muscle spasms (back spasms).    nitroGLYCERIN (NITROSTAT) 0.4 MG SL tablet Place 0.4 mg under the tongue every 5 (five) minutes as needed for chest pain.   omeprazole (PRILOSEC) 20 MG capsule Take 20 mg by mouth as needed for indigestion.   rOPINIRole (REQUIP) 4 MG tablet Take 4 mg by mouth 2 (two) times daily.   sacubitril-valsartan (ENTRESTO) 24-26 MG Take 1 tablet by mouth 2 (two) times  daily.   torsemide (DEMADEX) 20 MG tablet Take 20 mg by mouth daily.   zolpidem (AMBIEN) 5 MG tablet Take 5 mg by mouth at bedtime as needed for sleep.      Allergies:   Lasix [furosemide] and Sulfa antibiotics   Social History   Socioeconomic History   Marital status: Widowed    Spouse name: Not on file   Number of children: 2   Years of education: 12   Highest education level: High school graduate  Occupational History   Occupation: retired   Tobacco Use   Smoking status: Never   Smokeless tobacco: Never  Vaping Use   Vaping Use: Never used  Substance and Sexual Activity   Alcohol use: No   Drug use: No   Sexual activity: Not on file  Other Topics Concern   Not on file  Social History Narrative   Not on file   Social Determinants of Health   Financial Resource Strain: Low Risk  (  03/15/2020)   Overall Financial Resource Strain (CARDIA)    Difficulty of Paying Living Expenses: Not hard at all  Food Insecurity: No Food Insecurity (03/15/2020)   Hunger Vital Sign    Worried About Running Out of Food in the Last Year: Never true    Ran Out of Food in the Last Year: Never true  Transportation Needs: No Transportation Needs (03/15/2020)   PRAPARE - Hydrologist (Medical): No    Lack of Transportation (Non-Medical): No  Physical Activity: Insufficiently Active (03/15/2020)   Exercise Vital Sign    Days of Exercise per Week: 3 days    Minutes of Exercise per Session: 30 min  Stress: No Stress Concern Present (03/15/2020)   [place]    Feeling of Stress : Not at all  Social Connections: Moderately Integrated (03/15/2020)   Social Connection and Isolation Panel [NHANES]    Frequency of Communication with Friends and Family: More than three times a week    Frequency of Social Gatherings with Friends and Family: More than three times a week    Attends Religious Services: More  than 4 times per year    Active Member of [employer] or Organizations: Yes    Attends Archivist Meetings: More than 4 times per year    Marital Status: Widowed     Family History: The patient's family history includes CAD in her brother; Cancer in her brother; Heart attack in her father and mother; Pulmonary embolism in her brother.  ROS:   Please see the history of present illness.    All other systems reviewed and are negative.  EKGs/Labs/Other Studies Reviewed:    The following studies were reviewed today: Echo report discussed again with the patient at length.   Recent Labs: 01/10/2022: BUN 14; Creatinine, Ser 0.73; Potassium 4.0; Sodium 142  Recent Lipid Panel    Component Value Date/Time   CHOL 144 02/10/2019 0816   TRIG 54 02/10/2019 0816   HDL 70 02/10/2019 0816   CHOLHDL 2.1 02/10/2019 0816   LDLCALC 62 02/10/2019 0816    Physical Exam:    VS:  BP 137/60   Pulse 80   Ht 5' (1.524 m)   Wt 147 lb (66.7 kg)   SpO2 95%   BMI 28.71 kg/m     Wt Readings from Last 3 Encounters:  02/04/22 147 lb (66.7 kg)  01/10/22 149 lb 6.4 oz (67.8 kg)  01/03/22 145 lb 12.8 oz (66.1 kg)     GEN: Patient is in no acute distress HEENT: Normal NECK: No JVD; No carotid bruits LYMPHATICS: No lymphadenopathy CARDIAC: Hear sounds regular, 2/6 systolic murmur at the apex. RESPIRATORY:  Clear to auscultation without rales, wheezing or rhonchi  ABDOMEN: Soft, non-tender, non-distended MUSCULOSKELETAL:  No edema; No deformity  SKIN: Warm and dry NEUROLOGIC:  Alert and oriented x 3 PSYCHIATRIC:  Normal affect   Signed, [name], MD  02/04/2022 1:38 PM    [facility]

## 2022-02-07 DIAGNOSIS — M1990 Unspecified osteoarthritis, unspecified site: Secondary | ICD-10-CM | POA: Diagnosis not present

## 2022-02-07 DIAGNOSIS — E663 Overweight: Secondary | ICD-10-CM | POA: Diagnosis not present

## 2022-02-07 DIAGNOSIS — Z6828 Body mass index (BMI) 28.0-28.9, adult: Secondary | ICD-10-CM | POA: Diagnosis not present

## 2022-02-07 DIAGNOSIS — S81811D Laceration without foreign body, right lower leg, subsequent encounter: Secondary | ICD-10-CM | POA: Diagnosis not present

## 2022-02-07 DIAGNOSIS — I5020 Unspecified systolic (congestive) heart failure: Secondary | ICD-10-CM | POA: Diagnosis not present

## 2022-02-16 DIAGNOSIS — L03115 Cellulitis of right lower limb: Secondary | ICD-10-CM | POA: Diagnosis not present

## 2022-02-21 DIAGNOSIS — W19XXXA Unspecified fall, initial encounter: Secondary | ICD-10-CM | POA: Diagnosis not present

## 2022-02-21 DIAGNOSIS — S81811A Laceration without foreign body, right lower leg, initial encounter: Secondary | ICD-10-CM | POA: Diagnosis not present

## 2022-02-25 DIAGNOSIS — I87311 Chronic venous hypertension (idiopathic) with ulcer of right lower extremity: Secondary | ICD-10-CM | POA: Diagnosis not present

## 2022-02-26 DIAGNOSIS — W19XXXA Unspecified fall, initial encounter: Secondary | ICD-10-CM | POA: Diagnosis not present

## 2022-02-26 DIAGNOSIS — S81811A Laceration without foreign body, right lower leg, initial encounter: Secondary | ICD-10-CM | POA: Diagnosis not present

## 2022-02-27 ENCOUNTER — Other Ambulatory Visit: Payer: Medicare HMO

## 2022-02-27 DIAGNOSIS — M21612 Bunion of left foot: Secondary | ICD-10-CM | POA: Diagnosis not present

## 2022-02-27 DIAGNOSIS — M7752 Other enthesopathy of left foot: Secondary | ICD-10-CM

## 2022-02-27 HISTORY — DX: Other enthesopathy of left foot and ankle: M77.52

## 2022-03-04 DIAGNOSIS — L209 Atopic dermatitis, unspecified: Secondary | ICD-10-CM | POA: Diagnosis not present

## 2022-03-04 DIAGNOSIS — L570 Actinic keratosis: Secondary | ICD-10-CM | POA: Diagnosis not present

## 2022-03-04 DIAGNOSIS — D225 Melanocytic nevi of trunk: Secondary | ICD-10-CM | POA: Diagnosis not present

## 2022-03-04 DIAGNOSIS — L821 Other seborrheic keratosis: Secondary | ICD-10-CM | POA: Diagnosis not present

## 2022-03-04 DIAGNOSIS — L578 Other skin changes due to chronic exposure to nonionizing radiation: Secondary | ICD-10-CM | POA: Diagnosis not present

## 2022-03-04 DIAGNOSIS — C44529 Squamous cell carcinoma of skin of other part of trunk: Secondary | ICD-10-CM | POA: Diagnosis not present

## 2022-03-05 DIAGNOSIS — S81811A Laceration without foreign body, right lower leg, initial encounter: Secondary | ICD-10-CM | POA: Diagnosis not present

## 2022-03-05 DIAGNOSIS — X58XXXA Exposure to other specified factors, initial encounter: Secondary | ICD-10-CM | POA: Diagnosis not present

## 2022-03-08 DIAGNOSIS — H26493 Other secondary cataract, bilateral: Secondary | ICD-10-CM | POA: Diagnosis not present

## 2022-03-12 ENCOUNTER — Ambulatory Visit: Payer: Medicare HMO | Attending: Cardiology

## 2022-03-12 DIAGNOSIS — I340 Nonrheumatic mitral (valve) insufficiency: Secondary | ICD-10-CM

## 2022-03-12 DIAGNOSIS — X58XXXA Exposure to other specified factors, initial encounter: Secondary | ICD-10-CM | POA: Diagnosis not present

## 2022-03-12 DIAGNOSIS — I5022 Chronic systolic (congestive) heart failure: Secondary | ICD-10-CM | POA: Diagnosis not present

## 2022-03-12 DIAGNOSIS — S81811A Laceration without foreign body, right lower leg, initial encounter: Secondary | ICD-10-CM | POA: Diagnosis not present

## 2022-03-12 LAB — ECHOCARDIOGRAM COMPLETE
Area-P 1/2: 4.52 cm2
Calc EF: 50.9 %
MV M vel: 5.68 m/s
MV Peak grad: 129 mmHg
Radius: 0.4 cm
S' Lateral: 4 cm
Single Plane A2C EF: 47.8 %
Single Plane A4C EF: 50.6 %

## 2022-03-15 DIAGNOSIS — M2012 Hallux valgus (acquired), left foot: Secondary | ICD-10-CM | POA: Diagnosis not present

## 2022-03-15 DIAGNOSIS — M89372 Hypertrophy of bone, left ankle and foot: Secondary | ICD-10-CM | POA: Diagnosis not present

## 2022-03-15 DIAGNOSIS — M2022 Hallux rigidus, left foot: Secondary | ICD-10-CM | POA: Diagnosis not present

## 2022-03-20 DIAGNOSIS — S81811A Laceration without foreign body, right lower leg, initial encounter: Secondary | ICD-10-CM | POA: Diagnosis not present

## 2022-03-20 DIAGNOSIS — X58XXXA Exposure to other specified factors, initial encounter: Secondary | ICD-10-CM | POA: Diagnosis not present

## 2022-03-27 DIAGNOSIS — S81811A Laceration without foreign body, right lower leg, initial encounter: Secondary | ICD-10-CM | POA: Diagnosis not present

## 2022-03-27 DIAGNOSIS — X58XXXA Exposure to other specified factors, initial encounter: Secondary | ICD-10-CM | POA: Diagnosis not present

## 2022-04-03 DIAGNOSIS — S81811A Laceration without foreign body, right lower leg, initial encounter: Secondary | ICD-10-CM | POA: Diagnosis not present

## 2022-04-03 DIAGNOSIS — X58XXXA Exposure to other specified factors, initial encounter: Secondary | ICD-10-CM | POA: Diagnosis not present

## 2022-04-04 DIAGNOSIS — C44529 Squamous cell carcinoma of skin of other part of trunk: Secondary | ICD-10-CM | POA: Diagnosis not present

## 2022-04-04 DIAGNOSIS — S81811D Laceration without foreign body, right lower leg, subsequent encounter: Secondary | ICD-10-CM | POA: Diagnosis not present

## 2022-04-05 DIAGNOSIS — R059 Cough, unspecified: Secondary | ICD-10-CM | POA: Diagnosis not present

## 2022-04-05 DIAGNOSIS — R0600 Dyspnea, unspecified: Secondary | ICD-10-CM | POA: Diagnosis not present

## 2022-04-05 DIAGNOSIS — R051 Acute cough: Secondary | ICD-10-CM | POA: Diagnosis not present

## 2022-04-05 DIAGNOSIS — R0602 Shortness of breath: Secondary | ICD-10-CM | POA: Diagnosis not present

## 2022-04-09 ENCOUNTER — Other Ambulatory Visit: Payer: Self-pay

## 2022-04-11 DIAGNOSIS — M21612 Bunion of left foot: Secondary | ICD-10-CM | POA: Diagnosis not present

## 2022-04-11 DIAGNOSIS — L97319 Non-pressure chronic ulcer of right ankle with unspecified severity: Secondary | ICD-10-CM | POA: Diagnosis not present

## 2022-04-11 DIAGNOSIS — Z4889 Encounter for other specified surgical aftercare: Secondary | ICD-10-CM | POA: Diagnosis not present

## 2022-04-11 DIAGNOSIS — I83013 Varicose veins of right lower extremity with ulcer of ankle: Secondary | ICD-10-CM | POA: Diagnosis not present

## 2022-04-12 DIAGNOSIS — I83013 Varicose veins of right lower extremity with ulcer of ankle: Secondary | ICD-10-CM | POA: Insufficient documentation

## 2022-04-14 DIAGNOSIS — M4316 Spondylolisthesis, lumbar region: Secondary | ICD-10-CM | POA: Diagnosis not present

## 2022-04-14 DIAGNOSIS — I700 Atherosclerosis of aorta: Secondary | ICD-10-CM | POA: Diagnosis not present

## 2022-04-14 DIAGNOSIS — I447 Left bundle-branch block, unspecified: Secondary | ICD-10-CM | POA: Diagnosis not present

## 2022-04-14 DIAGNOSIS — R059 Cough, unspecified: Secondary | ICD-10-CM | POA: Diagnosis not present

## 2022-04-14 DIAGNOSIS — Z79899 Other long term (current) drug therapy: Secondary | ICD-10-CM | POA: Diagnosis not present

## 2022-04-14 DIAGNOSIS — I509 Heart failure, unspecified: Secondary | ICD-10-CM | POA: Diagnosis not present

## 2022-04-14 DIAGNOSIS — R69 Illness, unspecified: Secondary | ICD-10-CM | POA: Diagnosis not present

## 2022-04-14 DIAGNOSIS — M545 Low back pain, unspecified: Secondary | ICD-10-CM | POA: Diagnosis not present

## 2022-04-14 DIAGNOSIS — R509 Fever, unspecified: Secondary | ICD-10-CM | POA: Diagnosis not present

## 2022-04-14 DIAGNOSIS — B348 Other viral infections of unspecified site: Secondary | ICD-10-CM | POA: Diagnosis not present

## 2022-04-14 DIAGNOSIS — X501XXA Overexertion from prolonged static or awkward postures, initial encounter: Secondary | ICD-10-CM | POA: Diagnosis not present

## 2022-04-14 DIAGNOSIS — I110 Hypertensive heart disease with heart failure: Secondary | ICD-10-CM | POA: Diagnosis not present

## 2022-04-14 DIAGNOSIS — Z7982 Long term (current) use of aspirin: Secondary | ICD-10-CM | POA: Diagnosis not present

## 2022-04-14 DIAGNOSIS — J449 Chronic obstructive pulmonary disease, unspecified: Secondary | ICD-10-CM | POA: Diagnosis not present

## 2022-04-14 DIAGNOSIS — R079 Chest pain, unspecified: Secondary | ICD-10-CM | POA: Diagnosis not present

## 2022-04-14 DIAGNOSIS — I10 Essential (primary) hypertension: Secondary | ICD-10-CM | POA: Diagnosis not present

## 2022-04-14 DIAGNOSIS — Z20822 Contact with and (suspected) exposure to covid-19: Secondary | ICD-10-CM | POA: Diagnosis not present

## 2022-04-15 DIAGNOSIS — R69 Illness, unspecified: Secondary | ICD-10-CM | POA: Diagnosis not present

## 2022-04-15 DIAGNOSIS — R509 Fever, unspecified: Secondary | ICD-10-CM | POA: Diagnosis not present

## 2022-04-15 DIAGNOSIS — M545 Low back pain, unspecified: Secondary | ICD-10-CM | POA: Diagnosis not present

## 2022-04-17 ENCOUNTER — Ambulatory Visit: Payer: Medicare HMO | Attending: Cardiology | Admitting: Cardiology

## 2022-04-17 ENCOUNTER — Encounter: Payer: Self-pay | Admitting: Cardiology

## 2022-04-17 VITALS — BP 148/60 | HR 70 | Ht 60.0 in | Wt 155.8 lb

## 2022-04-17 DIAGNOSIS — I051 Rheumatic mitral insufficiency: Secondary | ICD-10-CM

## 2022-04-17 DIAGNOSIS — I5022 Chronic systolic (congestive) heart failure: Secondary | ICD-10-CM

## 2022-04-17 DIAGNOSIS — I2510 Atherosclerotic heart disease of native coronary artery without angina pectoris: Secondary | ICD-10-CM | POA: Diagnosis not present

## 2022-04-17 DIAGNOSIS — I10 Essential (primary) hypertension: Secondary | ICD-10-CM

## 2022-04-17 DIAGNOSIS — I2583 Coronary atherosclerosis due to lipid rich plaque: Secondary | ICD-10-CM

## 2022-04-17 MED ORDER — ENTRESTO 24-26 MG PO TABS: 1.0000 | ORAL_TABLET | Freq: Two times a day (BID) | ORAL | 3 refills | Status: DC

## 2022-04-17 NOTE — Progress Notes (Signed)
Cardiology Office Note:    Date:  04/17/2022   ID:  Kelsey Rivera, DOB Apr 26, 1946, MRN [id number]  PCP:  Street, Sharon Mt, MD  Cardiologist:  [name], MD   Referring MD: [address] [place] Mt, *    ASSESSMENT:    1. Coronary artery disease due to lipid rich plaque   2. Essential hypertension   3. Rheumatic mitral regurgitation   4. Chronic systolic heart failure (HCC)    PLAN:    In order of problems listed above:  Primary prevention stressed with patient.  Importance of compliance with diet medication stressed and he vocalized understanding.  She was advised to ambulate to the best of her ability. Cardiomyopathy: Ejection fraction is improved.  Patient is on Entresto and I will do a Chem-7 today.  I congratulated patient about her best efforts to take good care of herself. Essential hypertension: Blood pressure is stable and diet was emphasized.  Lifestyle modification urged. Mixed dyslipidemia: On lipid-lowering medications followed by primary care. Patient will be seen in follow-up appointment in 9 months or earlier if the patient has any concerns    Medication Adjustments/Labs and Tests Ordered: Current medicines are reviewed at length with the patient today.  Concerns regarding medicines are outlined above.  No orders of the defined types were placed in this encounter.  No orders of the defined types were placed in this encounter.    No chief complaint on file.    History of Present Illness:    Kelsey Rivera is a 76 y.o. female.  Patient has past medical history of cardiomyopathy, aortic atherosclerosis, essential hypertension, dyslipidemia.  She denies any problems at this time and takes care of activities of daily living.  No chest pain orthopnea or PND.  At the time of my evaluation, the patient is alert awake oriented and in no distress.  She is doing the best to ambulate well.  She is on Entresto and tolerating it well and ejection fraction  is back to normal and she is happy about it.  Past Medical History:  Diagnosis Date   AKI (acute kidney injury) ([place]) 05/20/2018   Arthritis of foot, left 01/09/2016   Baker's cyst of knee 02/06/2017   Bilateral lower extremity edema 05/20/2018   Bone spur of left foot 02/27/2022   Bunion of left foot 01/21/2022   Chest pain in adult 04/25/2015   Overview:  Lexiscan  MPS with normal perfusion and function, EF 52%   Chronic diastolic (congestive) heart failure ([place]) 05/20/2018   Echo - 04/2015 EF 41-28% Grade II diastolic dysfunction. Moderate mitral regurgitation   Chronic systolic heart failure (HCC)    CKD (chronic kidney disease) stage 3, GFR 30-59 ml/min (HCC) 05/20/2018   Closed dislocation of right elbow 04/24/2019   Added automatically from request for surgery [id number]   Coronary artery disease due to lipid rich plaque 10/22/2018   Degenerative arthritis of right foot 02/23/2020   Epistaxis 03/24/2020   Essential hypertension 04/26/2015   Extensor tendonitis of foot 12/14/2015   Overview:  Left   Finger pain, right 05/10/2019   GERD (gastroesophageal reflux disease)    Hyperlipidemia 02/06/2017   Injury of left foot 07/25/2015   LBBB (left bundle branch block) 04/25/2015   Migraine 02/06/2017   Mitral regurgitation 12/17/2017   Osteoarthritis 02/06/2017   Osteoarthritis of left foot 11/14/2015   Painful orthopaedic hardware ([place]) 09/20/2019   Pathological fracture of metatarsal bone of left foot with routine healing 08/15/2015  Plantar fasciitis of left foot 06/03/2017   Primary hypertension 04/26/2015   Primary osteoarthritis of both hands 08/22/2020   RLS (restless legs syndrome) 04/26/2015   Status post hardware removal 11/04/2019   Symptomatic bradycardia 03/15/2020   Transient complete heart block ([place]) 03/17/2020   Venous ulcer of left leg ([place]) 02/19/2017    Past Surgical History:  Procedure Laterality Date   ABDOMINAL HYSTERECTOMY     CHOLECYSTECTOMY      FOOT FRACTURE SURGERY     RIGHT/LEFT HEART CATH AND CORONARY ANGIOGRAPHY N/A 03/15/2020   Procedure: RIGHT/LEFT HEART CATH AND CORONARY ANGIOGRAPHY;  Surgeon: Jettie Booze, MD;  Location: [facility];  Service: Cardiovascular;  Laterality: N/A;   TEE WITHOUT CARDIOVERSION N/A 03/17/2020   Procedure: TRANSESOPHAGEAL ECHOCARDIOGRAM (TEE);  Surgeon: Sanda Klein, MD;  Location: [facility];  Service: Cardiovascular;  Laterality: N/A;   TONSILLECTOMY     VEIN SURGERY      Current Medications: Current Meds  Medication Sig   albuterol (VENTOLIN HFA) 108 (90 Base) MCG/ACT inhaler Inhale 1-2 puffs into the lungs as needed for wheezing or shortness of breath.    amLODipine (NORVASC) 5 MG tablet Take 5 mg by mouth daily.   aspirin EC 81 MG tablet Take 1 tablet (81 mg total) by mouth daily.   atorvastatin (LIPITOR) 20 MG tablet Take 1 tablet (20 mg total) by mouth daily.   bumetanide (BUMEX) 2 MG tablet Take 2 mg by mouth every other day.   Calcium Carb-Cholecalciferol (CALCIUM-VITAMIN D3) 600-400 MG-UNIT TABS Take 2 tablets by mouth daily.   diazepam (VALIUM) 10 MG tablet Take 10 mg by mouth every other day.   escitalopram (LEXAPRO) 5 MG tablet Take 5 mg by mouth daily.   gabapentin (NEURONTIN) 300 MG capsule Take 300 mg by mouth at bedtime.    hydrochlorothiazide (HYDRODIURIL) 25 MG tablet Take 25 mg by mouth daily.    hydrOXYzine (ATARAX/VISTARIL) 25 MG tablet Take 25 mg by mouth as needed for anxiety.    ketoconazole (NIZORAL) 2 % cream Apply 1 application topically as needed for rash.   meloxicam (MOBIC) 15 MG tablet Take 15 mg by mouth daily.   methocarbamol (ROBAXIN) 500 MG tablet Take 1,000 mg by mouth daily as needed for muscle spasms (back spasms).    nitroGLYCERIN (NITROSTAT) 0.4 MG SL tablet Place 0.4 mg under the tongue every 5 (five) minutes as needed for chest pain.   omeprazole (PRILOSEC) 20 MG capsule Take 20 mg by mouth as needed for indigestion.   Potassium  Chloride ER 20 MEQ TBCR Take 20 mEq by mouth daily.   rOPINIRole (REQUIP) 4 MG tablet Take 4 mg by mouth 2 (two) times daily.   sacubitril-valsartan (ENTRESTO) 24-26 MG Take 1 tablet by mouth 2 (two) times daily.   torsemide (DEMADEX) 20 MG tablet Take 20 mg by mouth daily.   traMADol (ULTRAM) 50 MG tablet Take 50 mg by mouth every 6 (six) hours as needed for moderate pain or severe pain.   valsartan (DIOVAN) 40 MG tablet Take 40 mg by mouth 2 (two) times daily.   zolpidem (AMBIEN) 5 MG tablet Take 5 mg by mouth at bedtime as needed for sleep.      Allergies:   Lasix [furosemide] and Sulfa antibiotics   Social History   Socioeconomic History   Marital status: Widowed    Spouse name: Not on file   Number of children: 2   Years of education: 12   Highest education level:  High school graduate  Occupational History   Occupation: retired   Tobacco Use   Smoking status: Never   Smokeless tobacco: Never  Vaping Use   Vaping Use: Never used  Substance and Sexual Activity   Alcohol use: No   Drug use: No   Sexual activity: Not on file  Other Topics Concern   Not on file  Social History Narrative   Not on file   Social Determinants of Health   Financial Resource Strain: Low Risk  (03/15/2020)   Overall Financial Resource Strain (CARDIA)    Difficulty of Paying Living Expenses: Not hard at all  Food Insecurity: No Food Insecurity (03/15/2020)   Hunger Vital Sign    Worried About Running Out of Food in the Last Year: Never true    Ran Out of Food in the Last Year: Never true  Transportation Needs: No Transportation Needs (03/15/2020)   PRAPARE - Hydrologist (Medical): No    Lack of Transportation (Non-Medical): No  Physical Activity: Insufficiently Active (03/15/2020)   Exercise Vital Sign    Days of Exercise per Week: 3 days    Minutes of Exercise per Session: 30 min  Stress: No Stress Concern Present (03/15/2020)   [place]    Feeling of Stress : Not at all  Social Connections: Moderately Integrated (03/15/2020)   Social Connection and Isolation Panel [NHANES]    Frequency of Communication with Friends and Family: More than three times a week    Frequency of Social Gatherings with Friends and Family: More than three times a week    Attends Religious Services: More than 4 times per year    Active Member of [employer] or Organizations: Yes    Attends Archivist Meetings: More than 4 times per year    Marital Status: Widowed     Family History: The patient's family history includes CAD in her brother; Cancer in her brother; Heart attack in her father and mother; Pulmonary embolism in her brother.  ROS:   Please see the history of present illness.    All other systems reviewed and are negative.  EKGs/Labs/Other Studies Reviewed:    The following studies were reviewed today: I discussed my findings with the patient at length.   Recent Labs: 01/10/2022: BUN 14; Creatinine, Ser 0.73; Potassium 4.0; Sodium 142  Recent Lipid Panel    Component Value Date/Time   CHOL 144 02/10/2019 0816   TRIG 54 02/10/2019 0816   HDL 70 02/10/2019 0816   CHOLHDL 2.1 02/10/2019 0816   LDLCALC 62 02/10/2019 0816    Physical Exam:    VS:  BP (!) 148/60   Pulse 70   Ht 5' (1.524 m)   Wt 155 lb 12.8 oz (70.7 kg)   SpO2 99%   BMI 30.43 kg/m     Wt Readings from Last 3 Encounters:  04/17/22 155 lb 12.8 oz (70.7 kg)  02/04/22 147 lb (66.7 kg)  01/10/22 149 lb 6.4 oz (67.8 kg)     GEN: Patient is in no acute distress HEENT: Normal NECK: No JVD; No carotid bruits LYMPHATICS: No lymphadenopathy CARDIAC: Hear sounds regular, 2/6 systolic murmur at the apex. RESPIRATORY:  Clear to auscultation without rales, wheezing or rhonchi  ABDOMEN: Soft, non-tender, non-distended MUSCULOSKELETAL:  No edema; No deformity  SKIN: Warm and dry NEUROLOGIC:  Alert and  oriented x 3 PSYCHIATRIC:  Normal affect   Signed, Reita Cliche  Brittain Smithey, MD  04/17/2022 2:24 PM    [place]

## 2022-04-17 NOTE — Patient Instructions (Signed)
Medication Instructions:  Your physician recommends that you continue on your current medications as directed. Please refer to the Current Medication list given to you today.  *If you need a refill on your cardiac medications before your next appointment, please call your pharmacy*   Lab Work: Your physician recommends that you have a BMET done today in the office.  If you have labs (blood work) drawn today and your tests are completely normal, you will receive your results only by: [place] (if you have MyChart) OR A paper copy in the mail If you have any lab test that is abnormal or we need to change your treatment, we will call you to review the results.   Testing/Procedures: None ordered   Follow-Up: At [facility], you and your health needs are our priority.  As part of our continuing mission to provide you with exceptional heart care, we have created designated Provider Care Teams.  These Care Teams include your primary Cardiologist (physician) and Advanced Practice Providers (APPs -  Physician Assistants and Nurse Practitioners) who all work together to provide you with the care you need, when you need it.  We recommend signing up for the patient portal called "MyChart".  Sign up information is provided on this After Visit Summary.  MyChart is used to connect with patients for Virtual Visits (Telemedicine).  Patients are able to view lab/test results, encounter notes, upcoming appointments, etc.  Non-urgent messages can be sent to your provider as well.   To learn more about what you can do with MyChart, go to [email or web address].    Your next appointment:   9 month(s)  The format for your next appointment:   In Person  Provider:   Jyl Heinz, MD    Other Instructions none  Important Information About Sugar

## 2022-04-17 NOTE — Addendum Note (Signed)
Addended by: Truddie Hidden on: 04/17/2022 02:31 PM   Modules accepted: Orders

## 2022-04-18 DIAGNOSIS — I878 Other specified disorders of veins: Secondary | ICD-10-CM | POA: Diagnosis not present

## 2022-04-18 DIAGNOSIS — L97319 Non-pressure chronic ulcer of right ankle with unspecified severity: Secondary | ICD-10-CM | POA: Diagnosis not present

## 2022-04-24 DIAGNOSIS — L97319 Non-pressure chronic ulcer of right ankle with unspecified severity: Secondary | ICD-10-CM | POA: Diagnosis not present

## 2022-04-24 DIAGNOSIS — I878 Other specified disorders of veins: Secondary | ICD-10-CM | POA: Diagnosis not present

## 2022-04-24 DIAGNOSIS — R600 Localized edema: Secondary | ICD-10-CM | POA: Diagnosis not present

## 2022-05-02 DIAGNOSIS — L97319 Non-pressure chronic ulcer of right ankle with unspecified severity: Secondary | ICD-10-CM | POA: Diagnosis not present

## 2022-05-02 DIAGNOSIS — I83013 Varicose veins of right lower extremity with ulcer of ankle: Secondary | ICD-10-CM | POA: Diagnosis not present

## 2022-05-08 DIAGNOSIS — I83013 Varicose veins of right lower extremity with ulcer of ankle: Secondary | ICD-10-CM | POA: Diagnosis not present

## 2022-05-08 DIAGNOSIS — L97319 Non-pressure chronic ulcer of right ankle with unspecified severity: Secondary | ICD-10-CM | POA: Diagnosis not present

## 2022-05-08 DIAGNOSIS — R600 Localized edema: Secondary | ICD-10-CM | POA: Diagnosis not present

## 2022-05-17 DIAGNOSIS — H02831 Dermatochalasis of right upper eyelid: Secondary | ICD-10-CM | POA: Diagnosis not present

## 2022-05-17 DIAGNOSIS — H02834 Dermatochalasis of left upper eyelid: Secondary | ICD-10-CM | POA: Diagnosis not present

## 2022-05-17 DIAGNOSIS — H04203 Unspecified epiphora, bilateral lacrimal glands: Secondary | ICD-10-CM | POA: Diagnosis not present

## 2022-05-21 ENCOUNTER — Telehealth: Payer: Self-pay

## 2022-05-21 NOTE — Telephone Encounter (Signed)
   Pre-operative Risk Assessment    [name]  DOB: Sep 22, 1945 MRN: [id number]      Request for Surgical Clearance    Procedure:   ectropion repair  Date of Surgery:  Clearance 05/30/22                                 Surgeon:  Dr. Olive Bass Surgeon's Group or Practice Name:  [facility] Phone number:  [phone] Fax number:  [phone]   Type of Clearance Requested:   - Medical  - Pharmacy:  Hold Aspirin 7-10 days preop and 2 days postop    Type of Anesthesia:   IV sedation   Additional requests/questions:    Gretchen Short   05/21/2022, 7:44 AM

## 2022-05-21 NOTE — Telephone Encounter (Signed)
   Name: Kelsey Rivera  DOB: Jul 27, 1945  MRN: [id number]   Primary Cardiologist: [name], MD  Chart reviewed as part of pre-operative protocol coverage. Patient was contacted 05/21/2022 in reference to pre-operative risk assessment for pending surgery as outlined below.  Kelsey Rivera was last seen on 04/17/2022 by Dr. [name].  Since that day, Kelsey Rivera has done well from a cardiac standpoint.  Denies any new symptoms or concerns.  She is able to complete greater than 4 METS without difficulty.  Therefore, based on ACC/AHA guidelines, the patient would be at acceptable risk for the planned procedure without further cardiovascular testing.   The patient was advised that if she develops new symptoms prior to surgery to contact our office to arrange for a follow-up visit, and she verbalized understanding.  Per office protocol, she may hold Aspiring  for 7-10 days prior to procedure. Please resume Aspirin as soon as possible postprocedure, at the discretion of the surgeon.   I will route this recommendation to the requesting party via Epic fax function and remove from pre-op pool. Please call with questions.  Lenna Sciara, NP 05/21/2022, 11:50 AM

## 2022-05-29 DIAGNOSIS — H02132 Senile ectropion of right lower eyelid: Secondary | ICD-10-CM | POA: Diagnosis not present

## 2022-05-29 DIAGNOSIS — H02102 Unspecified ectropion of right lower eyelid: Secondary | ICD-10-CM | POA: Diagnosis not present

## 2022-05-29 DIAGNOSIS — H02105 Unspecified ectropion of left lower eyelid: Secondary | ICD-10-CM | POA: Diagnosis not present

## 2022-05-29 DIAGNOSIS — H02135 Senile ectropion of left lower eyelid: Secondary | ICD-10-CM | POA: Diagnosis not present

## 2022-06-11 DIAGNOSIS — N182 Chronic kidney disease, stage 2 (mild): Secondary | ICD-10-CM | POA: Diagnosis not present

## 2022-06-11 DIAGNOSIS — E785 Hyperlipidemia, unspecified: Secondary | ICD-10-CM | POA: Diagnosis not present

## 2022-06-11 DIAGNOSIS — Z008 Encounter for other general examination: Secondary | ICD-10-CM | POA: Diagnosis not present

## 2022-06-11 DIAGNOSIS — M1990 Unspecified osteoarthritis, unspecified site: Secondary | ICD-10-CM | POA: Diagnosis not present

## 2022-06-11 DIAGNOSIS — K5900 Constipation, unspecified: Secondary | ICD-10-CM | POA: Diagnosis not present

## 2022-06-11 DIAGNOSIS — K219 Gastro-esophageal reflux disease without esophagitis: Secondary | ICD-10-CM | POA: Diagnosis not present

## 2022-06-11 DIAGNOSIS — R32 Unspecified urinary incontinence: Secondary | ICD-10-CM | POA: Diagnosis not present

## 2022-06-11 DIAGNOSIS — I2510 Atherosclerotic heart disease of native coronary artery without angina pectoris: Secondary | ICD-10-CM | POA: Diagnosis not present

## 2022-06-11 DIAGNOSIS — M792 Neuralgia and neuritis, unspecified: Secondary | ICD-10-CM | POA: Diagnosis not present

## 2022-06-11 DIAGNOSIS — F3341 Major depressive disorder, recurrent, in partial remission: Secondary | ICD-10-CM | POA: Diagnosis not present

## 2022-06-11 DIAGNOSIS — G4700 Insomnia, unspecified: Secondary | ICD-10-CM | POA: Diagnosis not present

## 2022-06-11 DIAGNOSIS — R69 Illness, unspecified: Secondary | ICD-10-CM | POA: Diagnosis not present

## 2022-06-11 DIAGNOSIS — I509 Heart failure, unspecified: Secondary | ICD-10-CM | POA: Diagnosis not present

## 2022-06-25 ENCOUNTER — Other Ambulatory Visit: Payer: Self-pay | Admitting: Cardiology

## 2022-06-28 DIAGNOSIS — I252 Old myocardial infarction: Secondary | ICD-10-CM | POA: Diagnosis not present

## 2022-06-28 DIAGNOSIS — I70213 Atherosclerosis of native arteries of extremities with intermittent claudication, bilateral legs: Secondary | ICD-10-CM | POA: Diagnosis not present

## 2022-06-28 DIAGNOSIS — T466X5A Adverse effect of antihyperlipidemic and antiarteriosclerotic drugs, initial encounter: Secondary | ICD-10-CM | POA: Diagnosis not present

## 2022-06-28 DIAGNOSIS — K2960 Other gastritis without bleeding: Secondary | ICD-10-CM | POA: Diagnosis not present

## 2022-06-28 DIAGNOSIS — I5030 Unspecified diastolic (congestive) heart failure: Secondary | ICD-10-CM | POA: Diagnosis not present

## 2022-06-28 DIAGNOSIS — E785 Hyperlipidemia, unspecified: Secondary | ICD-10-CM | POA: Diagnosis not present

## 2022-06-28 DIAGNOSIS — I5020 Unspecified systolic (congestive) heart failure: Secondary | ICD-10-CM | POA: Diagnosis not present

## 2022-06-28 DIAGNOSIS — R7301 Impaired fasting glucose: Secondary | ICD-10-CM | POA: Diagnosis not present

## 2022-06-28 DIAGNOSIS — M7552 Bursitis of left shoulder: Secondary | ICD-10-CM | POA: Diagnosis not present

## 2022-06-28 DIAGNOSIS — G720 Drug-induced myopathy: Secondary | ICD-10-CM | POA: Diagnosis not present

## 2022-06-28 DIAGNOSIS — I2510 Atherosclerotic heart disease of native coronary artery without angina pectoris: Secondary | ICD-10-CM | POA: Diagnosis not present

## 2022-06-28 DIAGNOSIS — I110 Hypertensive heart disease with heart failure: Secondary | ICD-10-CM | POA: Diagnosis not present

## 2022-07-01 DIAGNOSIS — Z0000 Encounter for general adult medical examination without abnormal findings: Secondary | ICD-10-CM | POA: Diagnosis not present

## 2022-07-04 DIAGNOSIS — H02831 Dermatochalasis of right upper eyelid: Secondary | ICD-10-CM | POA: Diagnosis not present

## 2022-07-04 DIAGNOSIS — H02834 Dermatochalasis of left upper eyelid: Secondary | ICD-10-CM | POA: Diagnosis not present

## 2022-07-08 DIAGNOSIS — R55 Syncope and collapse: Secondary | ICD-10-CM | POA: Diagnosis not present

## 2022-07-10 DIAGNOSIS — M19042 Primary osteoarthritis, left hand: Secondary | ICD-10-CM | POA: Diagnosis not present

## 2022-07-10 DIAGNOSIS — M19041 Primary osteoarthritis, right hand: Secondary | ICD-10-CM | POA: Diagnosis not present

## 2022-07-28 DIAGNOSIS — I5020 Unspecified systolic (congestive) heart failure: Secondary | ICD-10-CM | POA: Diagnosis not present

## 2022-07-28 DIAGNOSIS — I110 Hypertensive heart disease with heart failure: Secondary | ICD-10-CM | POA: Diagnosis not present

## 2022-07-28 DIAGNOSIS — I2510 Atherosclerotic heart disease of native coronary artery without angina pectoris: Secondary | ICD-10-CM | POA: Diagnosis not present

## 2022-07-28 DIAGNOSIS — M8580 Other specified disorders of bone density and structure, unspecified site: Secondary | ICD-10-CM | POA: Diagnosis not present

## 2022-08-27 DIAGNOSIS — B349 Viral infection, unspecified: Secondary | ICD-10-CM | POA: Diagnosis not present

## 2022-08-27 DIAGNOSIS — R197 Diarrhea, unspecified: Secondary | ICD-10-CM | POA: Diagnosis not present

## 2022-09-02 DIAGNOSIS — L821 Other seborrheic keratosis: Secondary | ICD-10-CM | POA: Diagnosis not present

## 2022-09-02 DIAGNOSIS — L570 Actinic keratosis: Secondary | ICD-10-CM | POA: Diagnosis not present

## 2022-09-02 DIAGNOSIS — C44529 Squamous cell carcinoma of skin of other part of trunk: Secondary | ICD-10-CM | POA: Diagnosis not present

## 2022-09-03 DIAGNOSIS — S4992XA Unspecified injury of left shoulder and upper arm, initial encounter: Secondary | ICD-10-CM | POA: Diagnosis not present

## 2022-09-03 DIAGNOSIS — S46002A Unspecified injury of muscle(s) and tendon(s) of the rotator cuff of left shoulder, initial encounter: Secondary | ICD-10-CM | POA: Diagnosis not present

## 2022-09-03 DIAGNOSIS — E663 Overweight: Secondary | ICD-10-CM | POA: Diagnosis not present

## 2022-09-03 DIAGNOSIS — Z6829 Body mass index (BMI) 29.0-29.9, adult: Secondary | ICD-10-CM | POA: Diagnosis not present

## 2022-09-03 DIAGNOSIS — M1990 Unspecified osteoarthritis, unspecified site: Secondary | ICD-10-CM | POA: Diagnosis not present

## 2022-09-06 DIAGNOSIS — N907 Vulvar cyst: Secondary | ICD-10-CM | POA: Diagnosis not present

## 2022-09-06 DIAGNOSIS — L304 Erythema intertrigo: Secondary | ICD-10-CM | POA: Diagnosis not present

## 2022-09-10 DIAGNOSIS — M25512 Pain in left shoulder: Secondary | ICD-10-CM | POA: Diagnosis not present

## 2022-09-12 DIAGNOSIS — H02831 Dermatochalasis of right upper eyelid: Secondary | ICD-10-CM | POA: Diagnosis not present

## 2022-09-12 DIAGNOSIS — H02834 Dermatochalasis of left upper eyelid: Secondary | ICD-10-CM | POA: Diagnosis not present

## 2022-09-13 DIAGNOSIS — X509XXA Other and unspecified overexertion or strenuous movements or postures, initial encounter: Secondary | ICD-10-CM | POA: Diagnosis not present

## 2022-09-13 DIAGNOSIS — S4992XA Unspecified injury of left shoulder and upper arm, initial encounter: Secondary | ICD-10-CM | POA: Diagnosis not present

## 2022-09-13 DIAGNOSIS — M7552 Bursitis of left shoulder: Secondary | ICD-10-CM | POA: Diagnosis not present

## 2022-09-13 DIAGNOSIS — S46012A Strain of muscle(s) and tendon(s) of the rotator cuff of left shoulder, initial encounter: Secondary | ICD-10-CM | POA: Diagnosis not present

## 2022-09-16 DIAGNOSIS — M75102 Unspecified rotator cuff tear or rupture of left shoulder, not specified as traumatic: Secondary | ICD-10-CM | POA: Diagnosis not present

## 2022-09-16 DIAGNOSIS — M25512 Pain in left shoulder: Secondary | ICD-10-CM | POA: Diagnosis not present

## 2022-09-20 DIAGNOSIS — Z6829 Body mass index (BMI) 29.0-29.9, adult: Secondary | ICD-10-CM | POA: Diagnosis not present

## 2022-09-20 DIAGNOSIS — M1990 Unspecified osteoarthritis, unspecified site: Secondary | ICD-10-CM | POA: Diagnosis not present

## 2022-09-20 DIAGNOSIS — N907 Vulvar cyst: Secondary | ICD-10-CM | POA: Diagnosis not present

## 2022-10-02 DIAGNOSIS — M79672 Pain in left foot: Secondary | ICD-10-CM | POA: Diagnosis not present

## 2022-10-02 DIAGNOSIS — M7672 Peroneal tendinitis, left leg: Secondary | ICD-10-CM | POA: Diagnosis not present

## 2022-10-02 DIAGNOSIS — M21612 Bunion of left foot: Secondary | ICD-10-CM | POA: Diagnosis not present

## 2022-10-02 DIAGNOSIS — M2042 Other hammer toe(s) (acquired), left foot: Secondary | ICD-10-CM | POA: Diagnosis not present

## 2022-10-07 DIAGNOSIS — M75122 Complete rotator cuff tear or rupture of left shoulder, not specified as traumatic: Secondary | ICD-10-CM | POA: Diagnosis not present

## 2022-10-11 ENCOUNTER — Telehealth: Payer: Self-pay | Admitting: Cardiology

## 2022-10-11 ENCOUNTER — Telehealth: Payer: Self-pay | Admitting: *Deleted

## 2022-10-11 NOTE — Telephone Encounter (Signed)
   Pre-operative Risk Assessment    [name]  DOB: [birth year] MRN: [id number]      Request for Surgical Clearance    Procedure:  Left Shoulder Rotator  Cuff  Repair    Date of Surgery:  10-29-22                                   Surgeon:  Dr Jones Broom Surgeon's Group or Practice Name:   Phone number:  [phone] Fax number:  [phone]   Type of Clearance Requested:   Medicine- not sure and Medical    Type of Anesthesia:  Choice   Additional requests/questions:    Signed, Laurence Ferrari   10/11/2022, 2:32 PM

## 2022-10-11 NOTE — Telephone Encounter (Signed)
Primary Cardiologist:Rajan R Revankar, MD   Preoperative team, please contact this patient and set up a phone call appointment for further preoperative risk assessment. Please obtain consent and complete medication review. Thank you for your help.   Per office protocol and pending no concerning cardiac symptoms at time of visit, she may hold aspirin for 5-7 days prior to procedure and should resume as soon as hemodynamically stable postoperatively.  Levi Aland, NP-C  10/11/2022, 2:44 PM [address] Office [phone] Fax [phone]

## 2022-10-11 NOTE — Telephone Encounter (Signed)
  Patient Consent for Virtual Visit         Kelsey Rivera has provided verbal consent on 10/11/2022 for a virtual visit (video or telephone).   CONSENT FOR VIRTUAL VISIT FOR:  Kelsey Rivera  By participating in this virtual visit I agree to the following:  I hereby voluntarily request, consent and authorize [facility] and its employed or contracted physicians, physician assistants, nurse practitioners or other licensed health care professionals (the Practitioner), to provide me with telemedicine health care services (the "Services") as deemed necessary by the treating Practitioner. I acknowledge and consent to receive the Services by the Practitioner via telemedicine. I understand that the telemedicine visit will involve communicating with the Practitioner through live audiovisual communication technology and the disclosure of certain medical information by electronic transmission. I acknowledge that I have been given the opportunity to request an in-person assessment or other available alternative prior to the telemedicine visit and am voluntarily participating in the telemedicine visit.  I understand that I have the right to withhold or withdraw my consent to the use of telemedicine in the course of my care at any time, without affecting my right to future care or treatment, and that the Practitioner or I may terminate the telemedicine visit at any time. I understand that I have the right to inspect all information obtained and/or recorded in the course of the telemedicine visit and may receive copies of available information for a reasonable fee.  I understand that some of the potential risks of receiving the Services via telemedicine include:  Delay or interruption in medical evaluation due to technological equipment failure or disruption; Information transmitted may not be sufficient (e.g. poor resolution of images) to allow for appropriate medical decision making by the  Practitioner; and/or  In rare instances, security protocols could fail, causing a breach of personal health information.  Furthermore, I acknowledge that it is my responsibility to provide information about my medical history, conditions and care that is complete and accurate to the best of my ability. I acknowledge that Practitioner's advice, recommendations, and/or decision may be based on factors not within their control, such as incomplete or inaccurate data provided by me or distortions of diagnostic images or specimens that may result from electronic transmissions. I understand that the practice of medicine is not an exact science and that Practitioner makes no warranties or guarantees regarding treatment outcomes. I acknowledge that a copy of this consent can be made available to me via my patient portal [facility] MyChart), or I can request a printed copy by calling the office of [facility].    I understand that my insurance will be billed for this visit.   I have read or had this consent read to me. I understand the contents of this consent, which adequately explains the benefits and risks of the Services being provided via telemedicine.  I have been provided ample opportunity to ask questions regarding this consent and the Services and have had my questions answered to my satisfaction. I give my informed consent for the services to be provided through the use of telemedicine in my medical care

## 2022-10-18 ENCOUNTER — Other Ambulatory Visit: Payer: Self-pay

## 2022-10-18 MED ORDER — ATORVASTATIN CALCIUM 20 MG PO TABS: 20.0000 mg | ORAL_TABLET | Freq: Every day | ORAL | 1 refills | Status: DC

## 2022-10-21 ENCOUNTER — Ambulatory Visit: Payer: Medicare HMO | Attending: Cardiology

## 2022-10-21 DIAGNOSIS — Z01810 Encounter for preprocedural cardiovascular examination: Secondary | ICD-10-CM | POA: Diagnosis not present

## 2022-10-21 NOTE — Progress Notes (Signed)
Virtual Visit via Telephone Note   Because of Kelsey Rivera's co-morbid illnesses, she is at least at moderate risk for complications without adequate follow up.  This format is felt to be most appropriate for this patient at this time.  The patient did not have access to video technology/had technical difficulties with video requiring transitioning to audio format only (telephone).  All issues noted in this document were discussed and addressed.  No physical exam could be performed with this format.  Please refer to the patient's chart for her consent to telehealth for [facility].  Evaluation Performed:  Preoperative cardiovascular risk assessment _____________   Date:  10/21/2022   Patient ID:  Kelsey Rivera, DOB [birth year], MRN [id number] Patient Location:  Home Provider location:   Office  Primary Care Provider:  Street, Stephanie Coup, MD Primary Cardiologist:  Garwin Brothers, MD  Chief Complaint / Patient Profile   77 y.o. y/o female with a h/o coronary artery disease, essential hypertension, chronic systolic CHF who is pending left shoulder rotator cuff repair and presents today for telephonic preoperative cardiovascular risk assessment.  History of Present Illness    Kelsey Rivera is a 77 y.o. female who presents via audio/video conferencing for a telehealth visit today.  Pt was last seen in cardiology clinic on 04/17/22 by Dr. Tomie China.  At that time GENELL THEDE was doing well .  The patient is now pending procedure as outlined above. Since her last visit, she continues to be stable from a cardiac standpoint.   Today she denies chest pain, shortness of breath, lower extremity edema, fatigue, palpitations, melena, hematuria, hemoptysis, diaphoresis, weakness, presyncope, syncope, orthopnea, and PND.   Past Medical History    Past Medical History:  Diagnosis Date   AKI (acute kidney injury) (HCC) 05/20/2018   Arthritis of foot, left 01/09/2016    Baker's cyst of knee 02/06/2017   Bilateral lower extremity edema 05/20/2018   Bone spur of left foot 02/27/2022   Bunion of left foot 01/21/2022   Chest pain in adult 04/25/2015   Overview:  Lexiscan  MPS with normal perfusion and function, EF 52%   Chronic diastolic (congestive) heart failure (HCC) 05/20/2018   Echo - 04/2015 EF 55-60% Grade II diastolic dysfunction. Moderate mitral regurgitation   Chronic systolic heart failure (HCC)    CKD (chronic kidney disease) stage 3, GFR 30-59 ml/min (HCC) 05/20/2018   Closed dislocation of right elbow 04/24/2019   Added automatically from request for surgery [id number]   Coronary artery disease due to lipid rich plaque 10/22/2018   Degenerative arthritis of right foot 02/23/2020   Epistaxis 03/24/2020   Essential hypertension 04/26/2015   Extensor tendonitis of foot 12/14/2015   Overview:  Left   Finger pain, right 05/10/2019   GERD (gastroesophageal reflux disease)    Hyperlipidemia 02/06/2017   Injury of left foot 07/25/2015   LBBB (left bundle branch block) 04/25/2015   Migraine 02/06/2017   Mitral regurgitation 12/17/2017   Osteoarthritis 02/06/2017   Osteoarthritis of left foot 11/14/2015   Painful orthopaedic hardware (HCC) 09/20/2019   Pathological fracture of metatarsal bone of left foot with routine healing 08/15/2015   Plantar fasciitis of left foot 06/03/2017   Primary hypertension 04/26/2015   Primary osteoarthritis of both hands 08/22/2020   RLS (restless legs syndrome) 04/26/2015   Status post hardware removal 11/04/2019   Symptomatic bradycardia 03/15/2020   Transient complete heart block (HCC) 03/17/2020   Venous ulcer of left leg (HCC)  02/19/2017   Past Surgical History:  Procedure Laterality Date   ABDOMINAL HYSTERECTOMY     CHOLECYSTECTOMY     FOOT FRACTURE SURGERY     RIGHT/LEFT HEART CATH AND CORONARY ANGIOGRAPHY N/A 03/15/2020   Procedure: RIGHT/LEFT HEART CATH AND CORONARY ANGIOGRAPHY;  Surgeon: Corky Crafts, MD;  Location: [facility] INVASIVE CV LAB;  Service: Cardiovascular;  Laterality: N/A;   TEE WITHOUT CARDIOVERSION N/A 03/17/2020   Procedure: TRANSESOPHAGEAL ECHOCARDIOGRAM (TEE);  Surgeon: Thurmon Fair, MD;  Location: [facility] ENDOSCOPY;  Service: Cardiovascular;  Laterality: N/A;   TONSILLECTOMY     VEIN SURGERY      Allergies  Allergies  Allergen Reactions   Lasix [Furosemide] Other (See Comments)    Per pt damages kidneys    Sulfa Antibiotics Hives and Rash    Home Medications    Prior to Admission medications   Medication Sig Start Date End Date Taking? Authorizing Provider  albuterol (VENTOLIN HFA) 108 (90 Base) MCG/ACT inhaler Inhale 1-2 puffs into the lungs as needed for wheezing or shortness of breath.  05/28/19   [provider]  amLODipine (NORVASC) 5 MG tablet Take 5 mg by mouth daily. 01/26/22   [provider]  aspirin EC 81 MG tablet Take 1 tablet (81 mg total) by mouth daily. 10/22/18   Revankar, Aundra Dubin, MD  atorvastatin (LIPITOR) 20 MG tablet Take 1 tablet (20 mg total) by mouth daily. 10/18/22   Revankar, Aundra Dubin, MD  bumetanide (BUMEX) 2 MG tablet Take 2 mg by mouth every other day.    [provider]  Calcium Carb-Cholecalciferol (CALCIUM-VITAMIN D3) 600-400 MG-UNIT TABS Take 2 tablets by mouth daily. 07/13/19   [provider]  diazepam (VALIUM) 10 MG tablet Take 10 mg by mouth every other day.    [provider]  escitalopram (LEXAPRO) 5 MG tablet Take 5 mg by mouth daily. 05/07/18   [provider]  esomeprazole (NEXIUM) 20 MG capsule Take 20 mg by mouth daily at 12 noon. 07/18/15   [provider]  gabapentin (NEURONTIN) 300 MG capsule Take 300 mg by mouth at bedtime.  05/23/15   [provider]  hydrochlorothiazide (HYDRODIURIL) 25 MG tablet Take 25 mg by mouth daily.  02/21/17   [provider]  hydrOXYzine (ATARAX/VISTARIL) 25 MG tablet Take 25 mg by mouth as needed for anxiety.      [provider]  ketoconazole (NIZORAL) 2 % cream Apply 1 application topically as needed for rash.    [provider]  nitroGLYCERIN (NITROSTAT) 0.4 MG SL tablet Place 0.4 mg under the tongue every 5 (five) minutes as needed for chest pain.    [provider]  Potassium Chloride ER 20 MEQ TBCR Take 20 mEq by mouth daily. 03/30/22   [provider]  rOPINIRole (REQUIP) 4 MG tablet Take 4 mg by mouth 2 (two) times daily. 03/03/15   [provider]  sacubitril-valsartan (ENTRESTO) 24-26 MG Take 1 tablet by mouth 2 (two) times daily. 04/17/22   Revankar, Aundra Dubin, MD  traMADol (ULTRAM) 50 MG tablet Take 50 mg by mouth every 6 (six) hours as needed for moderate pain or severe pain. 04/15/22   [provider]  zolpidem (AMBIEN) 5 MG tablet Take 5 mg by mouth at bedtime as needed for sleep.  11/14/15   [provider]    Physical Exam    Vital Signs:  Waylan Rocher does not have vital signs available for review today.  Given telephonic  nature of communication, physical exam is limited. AAOx3. NAD. Normal affect.  Speech and respirations are unlabored.  Accessory Clinical Findings    None  Assessment & Plan    1.  Preoperative Cardiovascular Risk Assessment: Left Shoulder Rotator Cuff Repair,  Dr Jones Broom, 10/29/22, Fax number:  [phone]       Primary Cardiologist: Garwin Brothers, MD  Chart reviewed as part of pre-operative protocol coverage. Given past medical history and time since last visit, based on ACC/AHA guidelines, VIVIEN BARRETTO would be at acceptable risk for the planned procedure without further cardiovascular testing.   Her RCRI is a class II risk, 0.9% risk of major cardiac event.  She is able to complete greater than 4 METS of physical activity.  Per office protocol and pending no concerning cardiac symptoms at time of visit, she may hold aspirin for 5-7 days prior to procedure and should resume as  soon as hemodynamically stable postoperatively.   Patient was advised that if she develops new symptoms prior to surgery to contact our office to arrange a follow-up appointment.  She verbalized understanding.  I will route this recommendation to the requesting party via Epic fax function and remove from pre-op pool.       Time:   Today, I have spent 7 minutes with the patient with telehealth technology discussing medical history, symptoms, and management plan.  Prior to her phone evaluation I spent greater than 10 minutes reviewing her medical history and cardiac medications.   Ronney Asters, NP  10/21/2022, 7:37 AM

## 2022-10-23 DIAGNOSIS — M2042 Other hammer toe(s) (acquired), left foot: Secondary | ICD-10-CM | POA: Diagnosis not present

## 2022-10-24 MED ORDER — ATORVASTATIN CALCIUM 20 MG PO TABS: 20.0000 mg | ORAL_TABLET | Freq: Every day | ORAL | 1 refills | Status: DC

## 2022-10-24 NOTE — Addendum Note (Signed)
Addended by: Virgil Slinger, Elmarie Shiley L on: 10/24/2022 08:29 AM   Modules accepted: Orders

## 2022-10-27 DIAGNOSIS — I110 Hypertensive heart disease with heart failure: Secondary | ICD-10-CM | POA: Diagnosis not present

## 2022-10-27 DIAGNOSIS — I5020 Unspecified systolic (congestive) heart failure: Secondary | ICD-10-CM | POA: Diagnosis not present

## 2022-10-29 DIAGNOSIS — M7542 Impingement syndrome of left shoulder: Secondary | ICD-10-CM | POA: Diagnosis not present

## 2022-10-29 DIAGNOSIS — M7552 Bursitis of left shoulder: Secondary | ICD-10-CM | POA: Diagnosis not present

## 2022-10-29 DIAGNOSIS — S46012A Strain of muscle(s) and tendon(s) of the rotator cuff of left shoulder, initial encounter: Secondary | ICD-10-CM | POA: Diagnosis not present

## 2022-10-29 DIAGNOSIS — G8918 Other acute postprocedural pain: Secondary | ICD-10-CM | POA: Diagnosis not present

## 2022-10-29 DIAGNOSIS — M24112 Other articular cartilage disorders, left shoulder: Secondary | ICD-10-CM | POA: Diagnosis not present

## 2022-10-31 DIAGNOSIS — I10 Essential (primary) hypertension: Secondary | ICD-10-CM | POA: Diagnosis not present

## 2022-10-31 DIAGNOSIS — S63502A Unspecified sprain of left wrist, initial encounter: Secondary | ICD-10-CM | POA: Diagnosis not present

## 2022-10-31 DIAGNOSIS — J449 Chronic obstructive pulmonary disease, unspecified: Secondary | ICD-10-CM | POA: Diagnosis not present

## 2022-10-31 DIAGNOSIS — M1812 Unilateral primary osteoarthritis of first carpometacarpal joint, left hand: Secondary | ICD-10-CM | POA: Diagnosis not present

## 2022-10-31 DIAGNOSIS — M19032 Primary osteoarthritis, left wrist: Secondary | ICD-10-CM | POA: Diagnosis not present

## 2022-10-31 DIAGNOSIS — Z79899 Other long term (current) drug therapy: Secondary | ICD-10-CM | POA: Diagnosis not present

## 2022-10-31 DIAGNOSIS — E785 Hyperlipidemia, unspecified: Secondary | ICD-10-CM | POA: Diagnosis not present

## 2022-10-31 DIAGNOSIS — S6992XA Unspecified injury of left wrist, hand and finger(s), initial encounter: Secondary | ICD-10-CM | POA: Diagnosis not present

## 2022-10-31 DIAGNOSIS — W01198A Fall on same level from slipping, tripping and stumbling with subsequent striking against other object, initial encounter: Secondary | ICD-10-CM | POA: Diagnosis not present

## 2022-11-04 DIAGNOSIS — R2689 Other abnormalities of gait and mobility: Secondary | ICD-10-CM | POA: Diagnosis not present

## 2022-11-04 DIAGNOSIS — M25532 Pain in left wrist: Secondary | ICD-10-CM | POA: Diagnosis not present

## 2022-11-04 DIAGNOSIS — Z6830 Body mass index (BMI) 30.0-30.9, adult: Secondary | ICD-10-CM | POA: Diagnosis not present

## 2022-11-04 DIAGNOSIS — M25432 Effusion, left wrist: Secondary | ICD-10-CM | POA: Diagnosis not present

## 2022-11-06 DIAGNOSIS — S46012D Strain of muscle(s) and tendon(s) of the rotator cuff of left shoulder, subsequent encounter: Secondary | ICD-10-CM | POA: Diagnosis not present

## 2022-11-06 DIAGNOSIS — M1990 Unspecified osteoarthritis, unspecified site: Secondary | ICD-10-CM | POA: Diagnosis not present

## 2022-11-06 DIAGNOSIS — M7670 Peroneal tendinitis, unspecified leg: Secondary | ICD-10-CM | POA: Diagnosis not present

## 2022-11-06 DIAGNOSIS — M2042 Other hammer toe(s) (acquired), left foot: Secondary | ICD-10-CM | POA: Diagnosis not present

## 2022-11-06 DIAGNOSIS — K219 Gastro-esophageal reflux disease without esophagitis: Secondary | ICD-10-CM | POA: Diagnosis not present

## 2022-11-06 DIAGNOSIS — M25532 Pain in left wrist: Secondary | ICD-10-CM | POA: Diagnosis not present

## 2022-11-06 DIAGNOSIS — Z9181 History of falling: Secondary | ICD-10-CM | POA: Diagnosis not present

## 2022-11-06 DIAGNOSIS — I509 Heart failure, unspecified: Secondary | ICD-10-CM | POA: Diagnosis not present

## 2022-11-06 DIAGNOSIS — I8390 Asymptomatic varicose veins of unspecified lower extremity: Secondary | ICD-10-CM | POA: Diagnosis not present

## 2022-11-06 DIAGNOSIS — I110 Hypertensive heart disease with heart failure: Secondary | ICD-10-CM | POA: Diagnosis not present

## 2022-11-06 DIAGNOSIS — S46092D Other injury of muscle(s) and tendon(s) of the rotator cuff of left shoulder, subsequent encounter: Secondary | ICD-10-CM | POA: Diagnosis not present

## 2022-11-06 DIAGNOSIS — Z7982 Long term (current) use of aspirin: Secondary | ICD-10-CM | POA: Diagnosis not present

## 2022-11-06 DIAGNOSIS — J449 Chronic obstructive pulmonary disease, unspecified: Secondary | ICD-10-CM | POA: Diagnosis not present

## 2022-11-08 DIAGNOSIS — M7542 Impingement syndrome of left shoulder: Secondary | ICD-10-CM | POA: Diagnosis not present

## 2022-11-12 DIAGNOSIS — M25512 Pain in left shoulder: Secondary | ICD-10-CM | POA: Diagnosis not present

## 2022-11-12 DIAGNOSIS — M6281 Muscle weakness (generalized): Secondary | ICD-10-CM | POA: Diagnosis not present

## 2022-11-14 DIAGNOSIS — M6281 Muscle weakness (generalized): Secondary | ICD-10-CM | POA: Diagnosis not present

## 2022-11-14 DIAGNOSIS — M25512 Pain in left shoulder: Secondary | ICD-10-CM | POA: Diagnosis not present

## 2022-11-18 DIAGNOSIS — S46012D Strain of muscle(s) and tendon(s) of the rotator cuff of left shoulder, subsequent encounter: Secondary | ICD-10-CM | POA: Diagnosis not present

## 2022-11-19 DIAGNOSIS — M6281 Muscle weakness (generalized): Secondary | ICD-10-CM | POA: Diagnosis not present

## 2022-11-19 DIAGNOSIS — M25512 Pain in left shoulder: Secondary | ICD-10-CM | POA: Diagnosis not present

## 2022-11-20 DIAGNOSIS — Z6830 Body mass index (BMI) 30.0-30.9, adult: Secondary | ICD-10-CM | POA: Diagnosis not present

## 2022-11-20 DIAGNOSIS — M79642 Pain in left hand: Secondary | ICD-10-CM | POA: Diagnosis not present

## 2022-11-21 ENCOUNTER — Telehealth: Payer: Self-pay | Admitting: *Deleted

## 2022-11-21 DIAGNOSIS — M25512 Pain in left shoulder: Secondary | ICD-10-CM | POA: Diagnosis not present

## 2022-11-21 DIAGNOSIS — M6281 Muscle weakness (generalized): Secondary | ICD-10-CM | POA: Diagnosis not present

## 2022-11-21 NOTE — Progress Notes (Signed)
  Care Coordination  Outreach Note  11/21/2022 Name: Kelsey Rivera MRN: [id number] DOB: 1945/10/28   Care Coordination Outreach Attempts: An unsuccessful telephone outreach was attempted today to offer the patient information about available care coordination services.  Follow Up Plan:  Additional outreach attempts will be made to offer the patient care coordination information and services.   Encounter Outcome:  No Answer  Burman Nieves, CCMA Care Coordination Care Guide Direct Dial: [phone]

## 2022-11-22 NOTE — Progress Notes (Signed)
  Care Coordination   Note   11/22/2022 Name: Kelsey Rivera MRN: [id number] DOB: 07/23/1945  Kelsey Rivera is a 77 y.o. year old female who sees Street, Stephanie Coup, MD for primary care. I reached out to Waylan Rocher by phone today to offer care coordination services.  Ms. Stodghill was given information about Care Coordination services today including:   The Care Coordination services include support from the care team which includes your Nurse Coordinator, Clinical Social Worker, or Pharmacist.  The Care Coordination team is here to help remove barriers to the health concerns and goals most important to you. Care Coordination services are voluntary, and the patient may decline or stop services at any time by request to their care team member.   Care Coordination Consent Status: Patient agreed to services and verbal consent obtained.   Follow up plan:  Telephone appointment with care coordination team member scheduled for:  11/28/2022  Encounter Outcome:  Pt. Scheduled from referral   Burman Nieves, [facility] Care Coordination Care Guide Direct Dial: [phone]

## 2022-11-25 DIAGNOSIS — M25512 Pain in left shoulder: Secondary | ICD-10-CM | POA: Diagnosis not present

## 2022-11-25 DIAGNOSIS — M6281 Muscle weakness (generalized): Secondary | ICD-10-CM | POA: Diagnosis not present

## 2022-11-28 ENCOUNTER — Ambulatory Visit: Payer: Self-pay

## 2022-11-28 DIAGNOSIS — M25512 Pain in left shoulder: Secondary | ICD-10-CM | POA: Diagnosis not present

## 2022-11-28 DIAGNOSIS — M6281 Muscle weakness (generalized): Secondary | ICD-10-CM | POA: Diagnosis not present

## 2022-11-28 NOTE — Patient Outreach (Signed)
  Care Coordination   11/28/2022 Name: BOWEN BONTON MRN: [id number] DOB: [birth year]   Care Coordination Outreach Attempts:  An unsuccessful telephone outreach was attempted for a scheduled appointment today.  Follow Up Plan:  Additional outreach attempts will be made to offer the patient care coordination information and services.   Encounter Outcome:  No Answer   Care Coordination Interventions:  No, not indicated    Rowe Pavy, RN, BSN, [facility] [employer] [phone]

## 2022-12-02 DIAGNOSIS — M1812 Unilateral primary osteoarthritis of first carpometacarpal joint, left hand: Secondary | ICD-10-CM | POA: Diagnosis not present

## 2022-12-03 DIAGNOSIS — M25512 Pain in left shoulder: Secondary | ICD-10-CM | POA: Diagnosis not present

## 2022-12-03 DIAGNOSIS — M6281 Muscle weakness (generalized): Secondary | ICD-10-CM | POA: Diagnosis not present

## 2022-12-04 ENCOUNTER — Telehealth: Payer: Self-pay | Admitting: *Deleted

## 2022-12-04 NOTE — Progress Notes (Signed)
  Care Coordination Note  12/04/2022 Name: Kelsey Rivera MRN: [id number] DOB: [birth year]  Kelsey Rivera is a 77 y.o. year old female who is a primary care patient of Street, Stephanie Coup, MD and is actively engaged with the care management team. I reached out to Waylan Rocher by phone today to assist with re-scheduling a follow up visit with the RN Case Manager  Follow up plan: Unsuccessful telephone outreach attempt made. A HIPAA compliant phone message was left for the patient providing contact information and requesting a return call.   Burman Nieves, CCMA Care Coordination Care Guide Direct Dial: [phone]

## 2022-12-06 DIAGNOSIS — M25512 Pain in left shoulder: Secondary | ICD-10-CM | POA: Diagnosis not present

## 2022-12-06 DIAGNOSIS — M6281 Muscle weakness (generalized): Secondary | ICD-10-CM | POA: Diagnosis not present

## 2022-12-09 DIAGNOSIS — M6281 Muscle weakness (generalized): Secondary | ICD-10-CM | POA: Diagnosis not present

## 2022-12-09 DIAGNOSIS — M25512 Pain in left shoulder: Secondary | ICD-10-CM | POA: Diagnosis not present

## 2022-12-15 DIAGNOSIS — J069 Acute upper respiratory infection, unspecified: Secondary | ICD-10-CM | POA: Diagnosis not present

## 2022-12-15 DIAGNOSIS — R0981 Nasal congestion: Secondary | ICD-10-CM | POA: Diagnosis not present

## 2022-12-16 DIAGNOSIS — M6281 Muscle weakness (generalized): Secondary | ICD-10-CM | POA: Diagnosis not present

## 2022-12-16 DIAGNOSIS — M25512 Pain in left shoulder: Secondary | ICD-10-CM | POA: Diagnosis not present

## 2022-12-19 DIAGNOSIS — M25512 Pain in left shoulder: Secondary | ICD-10-CM | POA: Diagnosis not present

## 2022-12-19 DIAGNOSIS — M6281 Muscle weakness (generalized): Secondary | ICD-10-CM | POA: Diagnosis not present

## 2022-12-20 DIAGNOSIS — J40 Bronchitis, not specified as acute or chronic: Secondary | ICD-10-CM | POA: Diagnosis not present

## 2022-12-20 DIAGNOSIS — I5020 Unspecified systolic (congestive) heart failure: Secondary | ICD-10-CM | POA: Diagnosis not present

## 2022-12-20 DIAGNOSIS — J329 Chronic sinusitis, unspecified: Secondary | ICD-10-CM | POA: Diagnosis not present

## 2022-12-20 DIAGNOSIS — J45901 Unspecified asthma with (acute) exacerbation: Secondary | ICD-10-CM | POA: Diagnosis not present

## 2022-12-20 DIAGNOSIS — J431 Panlobular emphysema: Secondary | ICD-10-CM | POA: Diagnosis not present

## 2022-12-31 DIAGNOSIS — M25512 Pain in left shoulder: Secondary | ICD-10-CM | POA: Diagnosis not present

## 2022-12-31 DIAGNOSIS — M6281 Muscle weakness (generalized): Secondary | ICD-10-CM | POA: Diagnosis not present

## 2023-01-02 DIAGNOSIS — M6281 Muscle weakness (generalized): Secondary | ICD-10-CM | POA: Diagnosis not present

## 2023-01-02 DIAGNOSIS — M25512 Pain in left shoulder: Secondary | ICD-10-CM | POA: Diagnosis not present

## 2023-01-03 NOTE — Progress Notes (Signed)
  Care Coordination Note  01/03/2023 Name: Kelsey Rivera MRN: [id number] DOB: 12-18-1945  Kelsey Rivera is a 77 y.o. year old female who is a primary care patient of Street, Stephanie Coup, MD and is actively engaged with the care management team. I reached out to Waylan Rocher by phone today to assist with re-scheduling a follow up visit with the RN Case Manager  Follow up plan: We have been unable to make contact with the patient for follow up.   Burman Nieves, CCMA Care Coordination Care Guide Direct Dial: [phone]

## 2023-01-06 DIAGNOSIS — M6281 Muscle weakness (generalized): Secondary | ICD-10-CM | POA: Diagnosis not present

## 2023-01-06 DIAGNOSIS — F411 Generalized anxiety disorder: Secondary | ICD-10-CM | POA: Diagnosis not present

## 2023-01-06 DIAGNOSIS — M75102 Unspecified rotator cuff tear or rupture of left shoulder, not specified as traumatic: Secondary | ICD-10-CM | POA: Diagnosis not present

## 2023-01-06 DIAGNOSIS — E663 Overweight: Secondary | ICD-10-CM | POA: Diagnosis not present

## 2023-01-06 DIAGNOSIS — M25512 Pain in left shoulder: Secondary | ICD-10-CM | POA: Diagnosis not present

## 2023-01-06 DIAGNOSIS — Z6830 Body mass index (BMI) 30.0-30.9, adult: Secondary | ICD-10-CM | POA: Diagnosis not present

## 2023-01-06 DIAGNOSIS — S46812S Strain of other muscles, fascia and tendons at shoulder and upper arm level, left arm, sequela: Secondary | ICD-10-CM | POA: Diagnosis not present

## 2023-01-06 DIAGNOSIS — M7061 Trochanteric bursitis, right hip: Secondary | ICD-10-CM | POA: Diagnosis not present

## 2023-01-06 DIAGNOSIS — Z23 Encounter for immunization: Secondary | ICD-10-CM | POA: Diagnosis not present

## 2023-01-15 DIAGNOSIS — Z79899 Other long term (current) drug therapy: Secondary | ICD-10-CM | POA: Diagnosis not present

## 2023-01-15 DIAGNOSIS — M19042 Primary osteoarthritis, left hand: Secondary | ICD-10-CM | POA: Diagnosis not present

## 2023-01-15 DIAGNOSIS — M19041 Primary osteoarthritis, right hand: Secondary | ICD-10-CM | POA: Diagnosis not present

## 2023-01-15 DIAGNOSIS — M19072 Primary osteoarthritis, left ankle and foot: Secondary | ICD-10-CM | POA: Diagnosis not present

## 2023-01-15 DIAGNOSIS — M19272 Secondary osteoarthritis, left ankle and foot: Secondary | ICD-10-CM | POA: Diagnosis not present

## 2023-01-16 DIAGNOSIS — M25512 Pain in left shoulder: Secondary | ICD-10-CM | POA: Diagnosis not present

## 2023-01-16 DIAGNOSIS — M6281 Muscle weakness (generalized): Secondary | ICD-10-CM | POA: Diagnosis not present

## 2023-01-22 DIAGNOSIS — M6281 Muscle weakness (generalized): Secondary | ICD-10-CM | POA: Diagnosis not present

## 2023-01-22 DIAGNOSIS — M25512 Pain in left shoulder: Secondary | ICD-10-CM | POA: Diagnosis not present

## 2023-02-02 DIAGNOSIS — I447 Left bundle-branch block, unspecified: Secondary | ICD-10-CM | POA: Diagnosis not present

## 2023-02-02 DIAGNOSIS — I440 Atrioventricular block, first degree: Secondary | ICD-10-CM | POA: Diagnosis not present

## 2023-02-02 DIAGNOSIS — I509 Heart failure, unspecified: Secondary | ICD-10-CM | POA: Diagnosis not present

## 2023-02-02 DIAGNOSIS — R0989 Other specified symptoms and signs involving the circulatory and respiratory systems: Secondary | ICD-10-CM | POA: Diagnosis not present

## 2023-02-02 DIAGNOSIS — R600 Localized edema: Secondary | ICD-10-CM | POA: Diagnosis not present

## 2023-02-02 DIAGNOSIS — M7989 Other specified soft tissue disorders: Secondary | ICD-10-CM | POA: Diagnosis not present

## 2023-02-02 DIAGNOSIS — M7122 Synovial cyst of popliteal space [Baker], left knee: Secondary | ICD-10-CM | POA: Diagnosis not present

## 2023-02-02 DIAGNOSIS — R9431 Abnormal electrocardiogram [ECG] [EKG]: Secondary | ICD-10-CM | POA: Diagnosis not present

## 2023-02-02 DIAGNOSIS — I517 Cardiomegaly: Secondary | ICD-10-CM | POA: Diagnosis not present

## 2023-02-06 DIAGNOSIS — R252 Cramp and spasm: Secondary | ICD-10-CM | POA: Diagnosis not present

## 2023-02-06 DIAGNOSIS — Z6831 Body mass index (BMI) 31.0-31.9, adult: Secondary | ICD-10-CM | POA: Diagnosis not present

## 2023-02-06 DIAGNOSIS — R29818 Other symptoms and signs involving the nervous system: Secondary | ICD-10-CM | POA: Diagnosis not present

## 2023-02-06 DIAGNOSIS — G478 Other sleep disorders: Secondary | ICD-10-CM | POA: Diagnosis not present

## 2023-02-06 DIAGNOSIS — I5030 Unspecified diastolic (congestive) heart failure: Secondary | ICD-10-CM | POA: Diagnosis not present

## 2023-02-06 DIAGNOSIS — I70213 Atherosclerosis of native arteries of extremities with intermittent claudication, bilateral legs: Secondary | ICD-10-CM | POA: Diagnosis not present

## 2023-02-06 DIAGNOSIS — N1832 Chronic kidney disease, stage 3b: Secondary | ICD-10-CM | POA: Diagnosis not present

## 2023-02-06 DIAGNOSIS — I110 Hypertensive heart disease with heart failure: Secondary | ICD-10-CM | POA: Diagnosis not present

## 2023-02-06 DIAGNOSIS — G4719 Other hypersomnia: Secondary | ICD-10-CM | POA: Diagnosis not present

## 2023-02-06 DIAGNOSIS — E669 Obesity, unspecified: Secondary | ICD-10-CM | POA: Diagnosis not present

## 2023-02-06 DIAGNOSIS — I5020 Unspecified systolic (congestive) heart failure: Secondary | ICD-10-CM | POA: Diagnosis not present

## 2023-02-15 DIAGNOSIS — G4733 Obstructive sleep apnea (adult) (pediatric): Secondary | ICD-10-CM | POA: Diagnosis not present

## 2023-02-15 DIAGNOSIS — R0602 Shortness of breath: Secondary | ICD-10-CM | POA: Diagnosis not present

## 2023-02-20 DIAGNOSIS — R0902 Hypoxemia: Secondary | ICD-10-CM | POA: Diagnosis not present

## 2023-02-20 DIAGNOSIS — G4733 Obstructive sleep apnea (adult) (pediatric): Secondary | ICD-10-CM | POA: Diagnosis not present

## 2023-02-21 DIAGNOSIS — U071 COVID-19: Secondary | ICD-10-CM | POA: Diagnosis not present

## 2023-02-21 DIAGNOSIS — J439 Emphysema, unspecified: Secondary | ICD-10-CM | POA: Diagnosis not present

## 2023-02-21 DIAGNOSIS — R0602 Shortness of breath: Secondary | ICD-10-CM | POA: Diagnosis not present

## 2023-03-11 DIAGNOSIS — B349 Viral infection, unspecified: Secondary | ICD-10-CM | POA: Diagnosis not present

## 2023-03-14 DIAGNOSIS — G4733 Obstructive sleep apnea (adult) (pediatric): Secondary | ICD-10-CM | POA: Diagnosis not present

## 2023-03-23 DIAGNOSIS — G4733 Obstructive sleep apnea (adult) (pediatric): Secondary | ICD-10-CM | POA: Diagnosis not present

## 2023-03-23 DIAGNOSIS — R0902 Hypoxemia: Secondary | ICD-10-CM | POA: Diagnosis not present

## 2023-04-09 DIAGNOSIS — M5442 Lumbago with sciatica, left side: Secondary | ICD-10-CM | POA: Diagnosis not present

## 2023-04-10 DIAGNOSIS — C4442 Squamous cell carcinoma of skin of scalp and neck: Secondary | ICD-10-CM | POA: Diagnosis not present

## 2023-04-10 DIAGNOSIS — L820 Inflamed seborrheic keratosis: Secondary | ICD-10-CM | POA: Diagnosis not present

## 2023-04-10 DIAGNOSIS — L570 Actinic keratosis: Secondary | ICD-10-CM | POA: Diagnosis not present

## 2023-04-10 DIAGNOSIS — L578 Other skin changes due to chronic exposure to nonionizing radiation: Secondary | ICD-10-CM | POA: Diagnosis not present

## 2023-04-10 DIAGNOSIS — L814 Other melanin hyperpigmentation: Secondary | ICD-10-CM | POA: Diagnosis not present

## 2023-04-10 DIAGNOSIS — C44529 Squamous cell carcinoma of skin of other part of trunk: Secondary | ICD-10-CM | POA: Diagnosis not present

## 2023-04-14 DIAGNOSIS — Z9981 Dependence on supplemental oxygen: Secondary | ICD-10-CM | POA: Diagnosis not present

## 2023-04-14 DIAGNOSIS — E669 Obesity, unspecified: Secondary | ICD-10-CM | POA: Diagnosis not present

## 2023-04-14 DIAGNOSIS — J9611 Chronic respiratory failure with hypoxia: Secondary | ICD-10-CM | POA: Diagnosis not present

## 2023-04-14 DIAGNOSIS — G8929 Other chronic pain: Secondary | ICD-10-CM | POA: Diagnosis not present

## 2023-04-14 DIAGNOSIS — G4700 Insomnia, unspecified: Secondary | ICD-10-CM | POA: Diagnosis not present

## 2023-04-14 DIAGNOSIS — M545 Low back pain, unspecified: Secondary | ICD-10-CM | POA: Diagnosis not present

## 2023-04-14 DIAGNOSIS — M1990 Unspecified osteoarthritis, unspecified site: Secondary | ICD-10-CM | POA: Diagnosis not present

## 2023-04-14 DIAGNOSIS — F411 Generalized anxiety disorder: Secondary | ICD-10-CM | POA: Diagnosis not present

## 2023-04-14 DIAGNOSIS — Z6829 Body mass index (BMI) 29.0-29.9, adult: Secondary | ICD-10-CM | POA: Diagnosis not present

## 2023-04-14 DIAGNOSIS — G4733 Obstructive sleep apnea (adult) (pediatric): Secondary | ICD-10-CM | POA: Diagnosis not present

## 2023-04-14 DIAGNOSIS — J431 Panlobular emphysema: Secondary | ICD-10-CM | POA: Diagnosis not present

## 2023-04-22 DIAGNOSIS — R0902 Hypoxemia: Secondary | ICD-10-CM | POA: Diagnosis not present

## 2023-04-22 DIAGNOSIS — G4733 Obstructive sleep apnea (adult) (pediatric): Secondary | ICD-10-CM | POA: Diagnosis not present

## 2023-05-01 DIAGNOSIS — L570 Actinic keratosis: Secondary | ICD-10-CM | POA: Diagnosis not present

## 2023-05-01 DIAGNOSIS — C4441 Basal cell carcinoma of skin of scalp and neck: Secondary | ICD-10-CM | POA: Diagnosis not present

## 2023-05-13 DIAGNOSIS — I110 Hypertensive heart disease with heart failure: Secondary | ICD-10-CM | POA: Diagnosis not present

## 2023-05-13 DIAGNOSIS — M62830 Muscle spasm of back: Secondary | ICD-10-CM | POA: Diagnosis not present

## 2023-05-13 DIAGNOSIS — I70213 Atherosclerosis of native arteries of extremities with intermittent claudication, bilateral legs: Secondary | ICD-10-CM | POA: Diagnosis not present

## 2023-05-13 DIAGNOSIS — J9611 Chronic respiratory failure with hypoxia: Secondary | ICD-10-CM | POA: Diagnosis not present

## 2023-05-13 DIAGNOSIS — Z9981 Dependence on supplemental oxygen: Secondary | ICD-10-CM | POA: Diagnosis not present

## 2023-05-13 DIAGNOSIS — M1990 Unspecified osteoarthritis, unspecified site: Secondary | ICD-10-CM | POA: Diagnosis not present

## 2023-05-13 DIAGNOSIS — I5020 Unspecified systolic (congestive) heart failure: Secondary | ICD-10-CM | POA: Diagnosis not present

## 2023-05-13 DIAGNOSIS — N1832 Chronic kidney disease, stage 3b: Secondary | ICD-10-CM | POA: Diagnosis not present

## 2023-05-13 DIAGNOSIS — J431 Panlobular emphysema: Secondary | ICD-10-CM | POA: Diagnosis not present

## 2023-05-13 DIAGNOSIS — J189 Pneumonia, unspecified organism: Secondary | ICD-10-CM | POA: Diagnosis not present

## 2023-05-13 DIAGNOSIS — E785 Hyperlipidemia, unspecified: Secondary | ICD-10-CM | POA: Diagnosis not present

## 2023-05-13 DIAGNOSIS — I5030 Unspecified diastolic (congestive) heart failure: Secondary | ICD-10-CM | POA: Diagnosis not present

## 2023-05-20 DIAGNOSIS — G4733 Obstructive sleep apnea (adult) (pediatric): Secondary | ICD-10-CM | POA: Diagnosis not present

## 2023-05-31 DEATH — deceased
